# Patient Record
Sex: Female | Born: 2000 | Race: Black or African American | Hispanic: No | Marital: Single | Smoking: Never smoker
Health system: Southern US, Community
[De-identification: ages and names within clinical notes are randomized; demographics above are authoritative.]

## PROBLEM LIST (undated history)

## (undated) DIAGNOSIS — Z789 Other specified health status: Secondary | ICD-10-CM

## (undated) HISTORY — DX: Other specified health status: Z78.9

---
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
## 2018-01-01 DIAGNOSIS — Z3202 Encounter for pregnancy test, result negative: Secondary | ICD-10-CM | POA: Diagnosis not present

## 2018-01-01 DIAGNOSIS — Z3042 Encounter for surveillance of injectable contraceptive: Secondary | ICD-10-CM | POA: Diagnosis not present

## 2018-11-02 DIAGNOSIS — Z3049 Encounter for surveillance of other contraceptives: Secondary | ICD-10-CM | POA: Diagnosis not present

## 2018-11-17 DIAGNOSIS — Z3042 Encounter for surveillance of injectable contraceptive: Secondary | ICD-10-CM | POA: Diagnosis not present

## 2018-11-17 DIAGNOSIS — Z3009 Encounter for other general counseling and advice on contraception: Secondary | ICD-10-CM | POA: Diagnosis not present

## 2018-12-07 DIAGNOSIS — H90A31 Mixed conductive and sensorineural hearing loss, unilateral, right ear with restricted hearing on the contralateral side: Secondary | ICD-10-CM | POA: Diagnosis not present

## 2018-12-09 DIAGNOSIS — H5213 Myopia, bilateral: Secondary | ICD-10-CM | POA: Diagnosis not present

## 2018-12-19 DIAGNOSIS — H5213 Myopia, bilateral: Secondary | ICD-10-CM | POA: Diagnosis not present

## 2019-01-03 DIAGNOSIS — H52223 Regular astigmatism, bilateral: Secondary | ICD-10-CM | POA: Diagnosis not present

## 2019-01-20 ENCOUNTER — Encounter (HOSPITAL_COMMUNITY): Payer: Self-pay | Admitting: Emergency Medicine

## 2019-01-20 ENCOUNTER — Emergency Department (HOSPITAL_COMMUNITY): Payer: Medicaid Other

## 2019-01-20 ENCOUNTER — Other Ambulatory Visit: Payer: Self-pay

## 2019-01-20 ENCOUNTER — Emergency Department (HOSPITAL_COMMUNITY)
Admission: EM | Admit: 2019-01-20 | Discharge: 2019-01-20 | Disposition: A | Discharge status: Home / Self Care | Payer: Medicaid Other | Attending: Emergency Medicine | Admitting: Emergency Medicine

## 2019-01-20 DIAGNOSIS — Y939 Activity, unspecified: Secondary | ICD-10-CM | POA: Diagnosis not present

## 2019-01-20 DIAGNOSIS — Z23 Encounter for immunization: Secondary | ICD-10-CM | POA: Diagnosis not present

## 2019-01-20 DIAGNOSIS — W208XXA Other cause of strike by thrown, projected or falling object, initial encounter: Secondary | ICD-10-CM | POA: Diagnosis not present

## 2019-01-20 DIAGNOSIS — Y929 Unspecified place or not applicable: Secondary | ICD-10-CM | POA: Diagnosis not present

## 2019-01-20 DIAGNOSIS — S81812A Laceration without foreign body, left lower leg, initial encounter: Secondary | ICD-10-CM | POA: Insufficient documentation

## 2019-01-20 DIAGNOSIS — Y999 Unspecified external cause status: Secondary | ICD-10-CM | POA: Insufficient documentation

## 2019-01-20 DIAGNOSIS — S8012XA Contusion of left lower leg, initial encounter: Secondary | ICD-10-CM | POA: Insufficient documentation

## 2019-01-20 DIAGNOSIS — S8992XA Unspecified injury of left lower leg, initial encounter: Secondary | ICD-10-CM | POA: Diagnosis not present

## 2019-01-20 MED ORDER — TETANUS-DIPHTH-ACELL PERTUSSIS 5-2.5-18.5 LF-MCG/0.5 IM SUSP
0.5000 mL | Freq: Once | INTRAMUSCULAR | Status: AC
  Administered 2019-01-20: 0.5 mL via INTRAMUSCULAR
  Filled 2019-01-20: qty 0.5

## 2019-01-20 NOTE — ED Notes (Signed)
Patient verbalizes understanding of admission and discharge paperwork. Opportunity for questions and answers provided.
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
## 2019-01-20 NOTE — Discharge Instructions (Signed)
Please read attached information. If you experience any new or worsening signs or symptoms please return to the emergency room for evaluation. Please follow-up with your primary care provider or specialist as discussed.  °

## 2019-01-25 ENCOUNTER — Emergency Department (HOSPITAL_COMMUNITY)
Admission: EM | Admit: 2019-01-25 | Discharge: 2019-01-25 | Disposition: A | Discharge status: Home / Self Care | Payer: Medicaid Other | Attending: Emergency Medicine | Admitting: Emergency Medicine

## 2019-01-25 ENCOUNTER — Encounter (HOSPITAL_COMMUNITY): Payer: Self-pay | Admitting: Emergency Medicine

## 2019-01-25 ENCOUNTER — Other Ambulatory Visit: Payer: Self-pay

## 2019-01-25 ENCOUNTER — Emergency Department (HOSPITAL_BASED_OUTPATIENT_CLINIC_OR_DEPARTMENT_OTHER): Payer: Medicaid Other

## 2019-01-25 DIAGNOSIS — M7989 Other specified soft tissue disorders: Secondary | ICD-10-CM

## 2019-01-25 DIAGNOSIS — M79609 Pain in unspecified limb: Secondary | ICD-10-CM | POA: Diagnosis not present

## 2019-01-25 DIAGNOSIS — M79605 Pain in left leg: Secondary | ICD-10-CM | POA: Insufficient documentation

## 2019-01-25 MED ORDER — ACETAMINOPHEN 325 MG PO TABS
650.0000 mg | ORAL_TABLET | Freq: Once | ORAL | Status: AC
  Administered 2019-01-25: 650 mg via ORAL
  Filled 2019-01-25: qty 2

## 2019-01-25 MED ORDER — MELOXICAM 10 MG PO CAPS: 10.0000 mg | ORAL_CAPSULE | Freq: Every day | ORAL | 0 refills | Status: DC
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
## 2019-01-25 NOTE — ED Notes (Signed)
Patient and her mom at bedside both verbalized understanding of discharge instructions and deny any further needs or questions at this time. VS stable. Patient ambulatory with steady gait, assisted to ED entrance in wheelchair. Wound care education provided as well.
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
## 2019-01-25 NOTE — Progress Notes (Signed)
Lower extremity venous has been completed.   Preliminary results in CV Proc.   Whitney Gomez 01/25/2019 5:42 PM

## 2019-01-25 NOTE — Discharge Instructions (Addendum)
You were seen in the emergency department today for left lower leg pain.  Your ultrasound was negative for a blood clot.  Your x-ray from your prior visit was reviewed and did not show fracture or dislocation.  Your wounds appear to be healing appropriately.  Given your continued pain would like you to try taking meloxicam once per day as needed for pain/swelling.  - Meloxicam is a nonsteroidal anti-inflammatory medication that will help with pain and swelling. Be sure to take this medication as prescribed with food, 1 pill every 24 hours  It should be taken with food, as it can cause stomach upset, and more seriously, stomach bleeding. Do not take other nonsteroidal anti-inflammatory medications with this such as Advil, Motrin, Aleve, Naproxen, Goodie Powder, or Motrin.    You make take Tylenol per over the counter dosing with these medications.   We have prescribed you new medication(s) today. Discuss the medications prescribed today with your pharmacist as they can have adverse effects and interactions with your other medicines including over the counter and prescribed medications. Seek medical evaluation if you start to experience new or abnormal symptoms after taking one of these medicines, seek care immediately if you start to experience difficulty breathing, feeling of your throat closing, facial swelling, or rash as these could be indications of a more serious allergic reaction  Please call the orthopedic surgeons office in your discharge instructions for follow-up within the next 1 to 5 days.  Return to the ER for new or worsening symptoms including but not limited to increased pain, increased swelling, numbness, tingling, weakness, redness around the wounds, purulent drainage from the wounds, fever, or any other concerns.

## 2019-01-28 ENCOUNTER — Ambulatory Visit (INDEPENDENT_AMBULATORY_CARE_PROVIDER_SITE_OTHER): Payer: Self-pay | Admitting: Orthopedic Surgery

## 2019-01-28 ENCOUNTER — Telehealth: Payer: Self-pay

## 2019-01-28 ENCOUNTER — Encounter: Payer: Self-pay | Admitting: Orthopedic Surgery

## 2019-01-28 DIAGNOSIS — S8012XA Contusion of left lower leg, initial encounter: Secondary | ICD-10-CM

## 2019-01-28 NOTE — Telephone Encounter (Signed)
Talked with patient and advised her that note is ready to be picked up at the front desk.

## 2019-01-28 NOTE — Telephone Encounter (Signed)
Gustine for note can you please do and call patient?

## 2019-01-28 NOTE — Telephone Encounter (Signed)
Patient would like a note stating that she was seen in the office today.  Please call when note is ready.  Cb# is [phone].  Thank you.
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
## 2019-01-30 ENCOUNTER — Encounter: Payer: Self-pay | Admitting: Orthopedic Surgery

## 2019-02-09 ENCOUNTER — Other Ambulatory Visit: Payer: Self-pay

## 2019-02-09 ENCOUNTER — Encounter: Payer: Self-pay | Admitting: Family Medicine

## 2019-02-09 ENCOUNTER — Ambulatory Visit (INDEPENDENT_AMBULATORY_CARE_PROVIDER_SITE_OTHER): Payer: Medicaid Other | Admitting: Family Medicine

## 2019-02-09 VITALS — BP 102/61 | HR 89 | Temp 98.0°F | Ht 67.0 in | Wt 119.4 lb

## 2019-02-09 DIAGNOSIS — Z3202 Encounter for pregnancy test, result negative: Secondary | ICD-10-CM

## 2019-02-09 DIAGNOSIS — M79605 Pain in left leg: Secondary | ICD-10-CM

## 2019-02-09 DIAGNOSIS — Z3042 Encounter for surveillance of injectable contraceptive: Secondary | ICD-10-CM

## 2019-02-09 DIAGNOSIS — Z0000 Encounter for general adult medical examination without abnormal findings: Secondary | ICD-10-CM | POA: Diagnosis not present

## 2019-02-09 DIAGNOSIS — Z309 Encounter for contraceptive management, unspecified: Secondary | ICD-10-CM

## 2019-02-09 DIAGNOSIS — Z09 Encounter for follow-up examination after completed treatment for conditions other than malignant neoplasm: Secondary | ICD-10-CM | POA: Diagnosis not present

## 2019-02-09 LAB — POCT URINALYSIS DIPSTICK
Bilirubin, UA: NEGATIVE
Blood, UA: NEGATIVE
Glucose, UA: NEGATIVE
Ketones, UA: NEGATIVE
Leukocytes, UA: NEGATIVE
Nitrite, UA: NEGATIVE
Protein, UA: NEGATIVE
Spec Grav, UA: >=1.03 — AB (ref 1.010–1.025)
Urobilinogen, UA: 0.2 E.U./dL
pH, UA: 6 (ref 5.0–8.0)

## 2019-02-09 LAB — POCT URINE PREGNANCY: Preg Test, Ur: NEGATIVE

## 2019-02-09 MED ORDER — MELOXICAM 10 MG PO CAPS: 10.0000 mg | ORAL_CAPSULE | Freq: Every day | ORAL | 1 refills | Status: DC

## 2019-02-09 MED ORDER — MELOXICAM 7.5 MG PO TABS: 7.5000 mg | ORAL_TABLET | Freq: Every day | ORAL | 3 refills | Status: DC

## 2019-02-09 MED ORDER — MEDROXYPROGESTERONE ACETATE 150 MG/ML IM SUSP
150.0000 mg | Freq: Once | INTRAMUSCULAR | Status: AC
  Administered 2019-02-09: 150 mg via INTRAMUSCULAR
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
## 2019-02-09 NOTE — Patient Instructions (Signed)
Musculoskeletal Pain Musculoskeletal pain refers to aches and pains in your bones, joints, muscles, and the tissues that surround them. This pain can occur in any part of the body. It can last for a short time (acute) or a long time (chronic). A physical exam, lab tests, and imaging studies may be done to find the cause of your musculoskeletal pain. Follow these instructions at home:  Lifestyle  Try to control or lower your stress levels. Stress increases muscle tension and can worsen musculoskeletal pain. It is important to recognize when you are anxious or stressed and learn ways to manage it. This may include: ? Meditation or yoga. ? Cognitive or behavioral therapy. ? Acupuncture or massage therapy.  You may continue all activities unless the activities cause more pain. When the pain gets better, slowly resume your normal activities. Gradually increase the intensity and duration of your activities or exercise. Managing pain, stiffness, and swelling  Take over-the-counter and prescription medicines only as told by your health care provider.  When your pain is severe, bed rest may be helpful. Lie or sit in any position that is comfortable, but get out of bed and walk around at least every couple of hours.  If directed, apply heat to the affected area as often as told by your health care provider. Use the heat source that your health care provider recommends, such as a moist heat pack or a heating pad. ? Place a towel between your skin and the heat source. ? Leave the heat on for 20-30 minutes. ? Remove the heat if your skin turns bright red. This is especially important if you are unable to feel pain, heat, or cold. You may have a greater risk of getting burned.  If directed, put ice on the painful area. ? Put ice in a plastic bag. ? Place a towel between your skin and the bag. ? Leave the ice on for 20 minutes, 2-3 times a day. General instructions  Your health care provider may  recommend that you see a physical therapist. This person can help you come up with a safe exercise program. Do any exercises as told by your physical therapist.  Keep all follow-up visits, including any physical therapy visits, as told by your health care providers. This is important. Contact a health care provider if:  Your pain gets worse.  Medicines do not help ease your pain.  You cannot use the part of your body that hurts, such as your arm, leg, or neck.  You have trouble sleeping.  You have trouble doing your normal activities. Get help right away if:  You have a new injury and your pain is worse or different.  You feel numb or you have tingling in the painful area. Summary  Musculoskeletal pain refers to aches and pains in your bones, joints, muscles, and the tissues that surround them.  This pain can occur in any part of the body.  Your health care provider may recommend that you see a physical therapist. This person can help you come up with a safe exercise program. Do any exercises as told by your physical therapist.  Lower your stress level. Stress can worsen musculoskeletal pain. Ways to lower stress may include meditation, yoga, cognitive or behavioral therapy, acupuncture, and massage therapy. This information is not intended to replace advice given to you by your health care provider. Make sure you discuss any questions you have with your health care provider. Document Released: 04/28/2005 Document Revised: 04/10/2017 Document Reviewed:   05/28/2016 Elsevier Patient Education  2020 Elsevier Inc. Meloxicam capsules What is this medicine? MELOXICAM (mel OX i cam) is a non-steroidal anti-inflammatory drug (NSAID). It is used to reduce swelling and to treat pain. It is used for osteoarthritis. This medicine may be used for other purposes; ask your health care provider or pharmacist if you have questions. COMMON BRAND NAME(S): Vivlodex What should I tell my health care  provider before I take this medicine? They need to know if you have any of these conditions:  bleeding disorders  cigarette smoker  coronary artery bypass graft (CABG) surgery within the past 2 weeks  drink more than 3 alcohol-containing drinks per day  heart disease  high blood pressure  history of stomach bleeding  kidney disease  liver disease  lung or breathing disease, like asthma  stomach or intestine problems  an unusual or allergic reaction to meloxicam, aspirin, other NSAIDs, other medicines, foods, dyes, or preservatives  pregnant or trying to get pregnant  breast-feeding How should I use this medicine? Take this medicine by mouth with a full glass of water. Follow the directions on the prescription label. You can take it with or without food. If it upsets your stomach, take it with food. Take your medicine at regular intervals. Do not take it more often than directed. Do not stop taking except on your doctor's advice. A special MedGuide will be given to you by the pharmacist with each prescription and refill. Be sure to read this information carefully each time. Talk to your pediatrician regarding the use of this medicine in children. Special care may be needed. Patients over 11 years old may have a stronger reaction and need a smaller dose. Overdosage: If you think you have taken too much of this medicine contact a poison control center or emergency room at once. NOTE: This medicine is only for you. Do not share this medicine with others. What if I miss a dose? If you miss a dose, take it as soon as you can. If it is almost time for your next dose, take only that dose. Do not take double or extra doses. What may interact with this medicine? Do not take this medicine with any of the following medications:  cidofovir  ketorolac This medicine may also interact with the following medications:  aspirin and aspirin-like medicines  certain medicines for blood  pressure, heart disease, irregular heart beat  certain medicines for depression, anxiety, or psychotic disturbances  certain medicines that treat or prevent blood clots like warfarin, enoxaparin, dalteparin, apixaban, dabigatran, rivaroxaban  cyclosporine  diuretics  fluconazole  lithium  methotrexate  other NSAIDs, medicines for pain and inflammation, like ibuprofen and naproxen  pemetrexed This list may not describe all possible interactions. Give your health care provider a list of all the medicines, herbs, non-prescription drugs, or dietary supplements you use. Also tell them if you smoke, drink alcohol, or use illegal drugs. Some items may interact with your medicine. What should I watch for while using this medicine? Tell your doctor or healthcare provider if your symptoms do not start to get better or if they get worse. This medicine may cause serious skin reactions. They can happen weeks to months after starting the medicine. Contact your healthcare provider right away if you notice fevers or flu-like symptoms with a rash. The rash may be red or purple and then turn into blisters or peeling of the skin. Or, you might notice a red rash with swelling of the face, lips  or lymph nodes in your neck or under your arms. Do not take other medicines that contain aspirin, ibuprofen, or naproxen with this medicine. Side effects such as stomach upset, nausea, or ulcers may be more likely to occur. Many medicines available without a prescription should not be taken with this medicine. This medicine can cause ulcers and bleeding in the stomach and intestines at any time during treatment. This can happen with no warning and may cause death. There is increased risk with taking this medicine for a long time. Smoking, drinking alcohol, older age, and poor health can also increase risks. Call your doctor right away if you have stomach pain or blood in your vomit or stool. This medicine does not prevent  heart attack or stroke. In fact, this medicine may increase the chance of a heart attack or stroke. The chance may increase with longer use of this medicine and in people who have heart disease. If you take aspirin to prevent heart attack or stroke, talk with your doctor or healthcare provider. What side effects may I notice from receiving this medicine? Side effects that you should report to your doctor or health care professional as soon as possible:  allergic reactions like skin rash, itching or hives, swelling of the face, lips, or tongue  nausea, vomiting  redness, blistering, peeling, or loosening of the skin, including inside the mouth  signs and symptoms of a blood clot such as breathing problems; changes in vision; chest pain; severe, sudden headache; pain, swelling, warmth in the leg; trouble speaking; sudden numbness or weakness of the face, arm, or leg  signs and symptoms of bleeding such as bloody or black, tarry stools; red or dark-brown urine; spitting up blood or brown material that looks like coffee grounds; red spots on the skin; unusual bruising or bleeding from the eye, gums, or nose  signs and symptoms of liver injury like dark yellow or brown urine; general ill feeling or flu-like symptoms; light-colored stools; loss of appetite; nausea; right upper belly pain; unusually weak or tired; yellowing of the eyes or skin  signs and symptoms of stroke like changes in vision; confusion; trouble speaking or understanding; severe headaches; sudden numbness or weakness of the face, arm, or leg; trouble walking; dizziness; loss of balance or coordination Side effects that usually do not require medical attention (report to your doctor or health care professional if they continue or are bothersome):  constipation  diarrhea  gas This list may not describe all possible side effects. Call your doctor for medical advice about side effects. You may report side effects to FDA at  1-800-FDA-1088. Where should I keep my medicine? Keep out of the reach of children. Store at room temperature between 15 and 30 degrees C (59 and 86 degrees F). Throw away any unused medicine after the expiration date. NOTE: This sheet is a summary. It may not cover all possible information. If you have questions about this medicine, talk to your doctor, pharmacist, or health care provider.  2020 Elsevier/Gold Standard (2018-07-28 11:19:03)

## 2019-02-10 ENCOUNTER — Encounter: Payer: Self-pay | Admitting: Family Medicine

## 2019-02-10 DIAGNOSIS — M79605 Pain in left leg: Secondary | ICD-10-CM | POA: Insufficient documentation

## 2019-02-10 DIAGNOSIS — Z309 Encounter for contraceptive management, unspecified: Secondary | ICD-10-CM | POA: Insufficient documentation

## 2019-02-10 LAB — CBC WITH DIFFERENTIAL/PLATELET
Basophils Absolute: 0 10*3/uL (ref 0.0–0.2)
Basos: 1 %
EOS (ABSOLUTE): 0 10*3/uL (ref 0.0–0.4)
Eos: 1 %
Hematocrit: 39.3 % (ref 34.0–46.6)
Hemoglobin: 13.3 g/dL (ref 11.1–15.9)
Immature Grans (Abs): 0 10*3/uL (ref 0.0–0.1)
Immature Granulocytes: 0 %
Lymphocytes Absolute: 1.7 10*3/uL (ref 0.7–3.1)
Lymphs: 51 %
MCH: 30.6 pg (ref 26.6–33.0)
MCHC: 33.8 g/dL (ref 31.5–35.7)
MCV: 91 fL (ref 79–97)
Monocytes Absolute: 0.2 10*3/uL (ref 0.1–0.9)
Monocytes: 7 %
Neutrophils Absolute: 1.3 10*3/uL — ABNORMAL LOW (ref 1.4–7.0)
Neutrophils: 40 %
Platelets: 194 10*3/uL (ref 150–450)
RBC: 4.34 x10E6/uL (ref 3.77–5.28)
RDW: 12.5 % (ref 11.7–15.4)
WBC: 3.4 10*3/uL (ref 3.4–10.8)

## 2019-02-10 LAB — COMPREHENSIVE METABOLIC PANEL
ALT: 11 IU/L (ref 0–32)
AST: 19 IU/L (ref 0–40)
Albumin/Globulin Ratio: 2.3 — ABNORMAL HIGH (ref 1.2–2.2)
Albumin: 4.6 g/dL (ref 3.9–5.0)
Alkaline Phosphatase: 55 IU/L (ref 39–117)
BUN/Creatinine Ratio: 12 (ref 9–23)
BUN: 11 mg/dL (ref 6–20)
Bilirubin Total: 0.6 mg/dL (ref 0.0–1.2)
CO2: 22 mmol/L (ref 20–29)
Calcium: 9.7 mg/dL (ref 8.7–10.2)
Chloride: 105 mmol/L (ref 96–106)
Creatinine, Ser: 0.89 mg/dL (ref 0.57–1.00)
GFR calc Af Amer: 108 mL/min/{1.73_m2} (ref >59)
GFR calc non Af Amer: 94 mL/min/{1.73_m2} (ref >59)
Globulin, Total: 2 g/dL (ref 1.5–4.5)
Glucose: 62 mg/dL — ABNORMAL LOW (ref 65–99)
Potassium: 4 mmol/L (ref 3.5–5.2)
Sodium: 143 mmol/L (ref 134–144)
Total Protein: 6.6 g/dL (ref 6.0–8.5)

## 2019-02-10 LAB — HEPB+HEPC+HIV PANEL
HIV Screen 4th Generation wRfx: NONREACTIVE
Hep B C IgM: NEGATIVE
Hep B Core Total Ab: NEGATIVE
Hep B E Ab: NEGATIVE
Hep B E Ag: NEGATIVE
Hep B Surface Ab, Qual: REACTIVE
Hep C Virus Ab: <0.1 s/co ratio (ref 0.0–0.9)
Hepatitis B Surface Ag: NEGATIVE

## 2019-02-16 ENCOUNTER — Encounter: Payer: Self-pay | Admitting: Orthopedic Surgery

## 2019-02-16 ENCOUNTER — Ambulatory Visit (INDEPENDENT_AMBULATORY_CARE_PROVIDER_SITE_OTHER): Payer: Medicaid Other | Admitting: Orthopedic Surgery

## 2019-02-16 VITALS — Ht 67.0 in | Wt 122.0 lb

## 2019-02-16 DIAGNOSIS — S8012XA Contusion of left lower leg, initial encounter: Secondary | ICD-10-CM

## 2019-02-18 ENCOUNTER — Encounter: Payer: Self-pay | Admitting: Orthopedic Surgery
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
## 2019-02-24 ENCOUNTER — Encounter: Payer: Self-pay | Admitting: Physical Therapy

## 2019-02-24 ENCOUNTER — Ambulatory Visit: Payer: Medicaid Other | Attending: Orthopedic Surgery | Admitting: Physical Therapy

## 2019-02-24 ENCOUNTER — Other Ambulatory Visit: Payer: Self-pay

## 2019-02-24 DIAGNOSIS — S8012XA Contusion of left lower leg, initial encounter: Secondary | ICD-10-CM | POA: Insufficient documentation

## 2019-02-24 DIAGNOSIS — S8002XA Contusion of left knee, initial encounter: Secondary | ICD-10-CM | POA: Diagnosis not present
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
## 2019-03-18 ENCOUNTER — Ambulatory Visit: Payer: Medicaid Other | Admitting: Physical Therapy

## 2019-03-21 ENCOUNTER — Telehealth: Payer: Self-pay | Admitting: Family Medicine

## 2019-03-22 NOTE — Telephone Encounter (Signed)
Patient referral is still good until 02/2020. She needs to call back and make an appointment. I could not leave a message, mailbox full.

## 2019-03-29 ENCOUNTER — Telehealth: Payer: Self-pay

## 2019-03-29 ENCOUNTER — Other Ambulatory Visit: Payer: Self-pay | Admitting: Family Medicine

## 2019-03-29 NOTE — Telephone Encounter (Signed)
Whitney Gomez is seeking another referral to [facility]. For continued PT, ASAP.   Dx: V72.82SU (ICD-10-CM) - Contusion of left lower extremity, initial encounter.  Patient call back ph#[phone]

## 2019-03-30 ENCOUNTER — Other Ambulatory Visit: Payer: Self-pay | Admitting: Family Medicine

## 2019-03-30 DIAGNOSIS — M79605 Pain in left leg: Secondary | ICD-10-CM

## 2019-03-30 NOTE — Telephone Encounter (Signed)
Per PT note on  02/2019: Patient will benefit from skilled therapeutic intervention in order to improve the following deficits and impairments. It was a one time visit.      Will you put in another referral for more therapy?

## 2019-03-31 ENCOUNTER — Other Ambulatory Visit: Payer: Self-pay

## 2019-03-31 ENCOUNTER — Encounter: Payer: Self-pay | Admitting: Physical Therapy

## 2019-03-31 ENCOUNTER — Ambulatory Visit: Payer: Medicaid Other | Attending: Family Medicine | Admitting: Physical Therapy

## 2019-03-31 DIAGNOSIS — S8002XA Contusion of left knee, initial encounter: Secondary | ICD-10-CM | POA: Insufficient documentation

## 2019-03-31 DIAGNOSIS — S8012XA Contusion of left lower leg, initial encounter: Secondary | ICD-10-CM | POA: Insufficient documentation
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
## 2019-04-01 ENCOUNTER — Telehealth: Payer: Self-pay
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
## 2019-04-04 ENCOUNTER — Other Ambulatory Visit: Payer: Self-pay | Admitting: Family Medicine

## 2019-04-04 ENCOUNTER — Telehealth: Payer: Self-pay

## 2019-04-04 DIAGNOSIS — M79605 Pain in left leg: Secondary | ICD-10-CM

## 2019-04-04 NOTE — Telephone Encounter (Signed)
This was faxed over today 04/04/2019 @1 :43pm. Thanks !

## 2019-04-04 NOTE — Telephone Encounter (Signed)
Received a call from Physical Therapy in [place]. They need a written order for this. Can you please write this and we can fax it over to them. It must include "Physical Therapy to Evaluate and Treat" ICD-10 Dx code. And Provider signature. Thank you!

## 2019-04-04 NOTE — Telephone Encounter (Signed)
Rx paper set on desk

## 2019-04-12 DIAGNOSIS — H9192 Unspecified hearing loss, left ear: Secondary | ICD-10-CM

## 2019-04-12 HISTORY — DX: Unspecified hearing loss, left ear: H91.92

## 2019-04-28 ENCOUNTER — Telehealth: Payer: Self-pay

## 2019-04-28 ENCOUNTER — Encounter: Payer: Self-pay | Admitting: Family Medicine

## 2019-04-28 ENCOUNTER — Other Ambulatory Visit: Payer: Self-pay | Admitting: Family Medicine

## 2019-04-28 DIAGNOSIS — H9192 Unspecified hearing loss, left ear: Secondary | ICD-10-CM

## 2019-04-28 NOTE — Telephone Encounter (Signed)
Patient requesting a referral to [facility] Phone [phone]. Hearing evaluation for decreased hearing in the left ear. Patient is currently in her hometown.

## 2019-05-04 NOTE — Telephone Encounter (Signed)
Contacted pt and left a detailed vm informing pt that referral has been sent and if she has any questions or concerns to give a call

## 2019-05-09 ENCOUNTER — Telehealth: Payer: Self-pay

## 2019-05-09 NOTE — Telephone Encounter (Signed)
All information has been sent. Referral given by prior Pediatric PCP authorization code [id number] E- Good for Month of  January. Patient will need to up date PCP on medicaid card. Not able to leave message on  Voice mail.

## 2019-05-24 DIAGNOSIS — H9190 Unspecified hearing loss, unspecified ear: Secondary | ICD-10-CM | POA: Insufficient documentation

## 2019-05-30 ENCOUNTER — Other Ambulatory Visit: Payer: Self-pay

## 2019-05-30 ENCOUNTER — Ambulatory Visit (INDEPENDENT_AMBULATORY_CARE_PROVIDER_SITE_OTHER): Payer: Medicaid Other

## 2019-05-30 DIAGNOSIS — Z309 Encounter for contraceptive management, unspecified: Secondary | ICD-10-CM

## 2019-05-30 LAB — POCT URINE PREGNANCY: Preg Test, Ur: NEGATIVE

## 2019-05-30 MED ORDER — MEDROXYPROGESTERONE ACETATE 150 MG/ML IM SUSP
150.0000 mg | Freq: Once | INTRAMUSCULAR | Status: AC
  Administered 2019-05-30: 150 mg via INTRAMUSCULAR

## 2019-05-31 ENCOUNTER — Other Ambulatory Visit: Payer: Self-pay | Admitting: Family Medicine

## 2019-05-31 ENCOUNTER — Telehealth: Payer: Self-pay

## 2019-05-31 DIAGNOSIS — M79605 Pain in left leg: Secondary | ICD-10-CM

## 2019-05-31 NOTE — Telephone Encounter (Signed)
Patient called today. She is needing a new referral placed to see Physical Therapy for her left leg pain.We have referred her before a couple of months back but specialist office is saying she needs a new referral.  Referral needs to be sent to Outpatient Rehab in [place]. Can you please place referral?

## 2019-06-07 DIAGNOSIS — Z461 Encounter for fitting and adjustment of hearing aid: Secondary | ICD-10-CM | POA: Diagnosis not present

## 2019-06-07 DIAGNOSIS — H9190 Unspecified hearing loss, unspecified ear: Secondary | ICD-10-CM | POA: Diagnosis not present

## 2019-06-14 ENCOUNTER — Other Ambulatory Visit: Payer: Self-pay

## 2019-06-14 ENCOUNTER — Ambulatory Visit (INDEPENDENT_AMBULATORY_CARE_PROVIDER_SITE_OTHER): Payer: Medicaid Other | Admitting: Orthopedic Surgery

## 2019-06-14 DIAGNOSIS — S8012XA Contusion of left lower leg, initial encounter: Secondary | ICD-10-CM | POA: Diagnosis not present

## 2019-06-15 ENCOUNTER — Ambulatory Visit: Payer: No Typology Code available for payment source | Admitting: Physical Therapy

## 2019-06-18 ENCOUNTER — Encounter: Payer: Self-pay | Admitting: Orthopedic Surgery
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
## 2019-08-22 ENCOUNTER — Encounter: Payer: Self-pay | Admitting: Family Medicine

## 2019-08-22 ENCOUNTER — Other Ambulatory Visit: Payer: Self-pay

## 2019-08-22 ENCOUNTER — Ambulatory Visit (INDEPENDENT_AMBULATORY_CARE_PROVIDER_SITE_OTHER): Payer: Medicaid Other | Admitting: Family Medicine

## 2019-08-22 VITALS — Temp 98.9°F | Ht 67.0 in | Wt 117.2 lb

## 2019-08-22 DIAGNOSIS — Z3042 Encounter for surveillance of injectable contraceptive: Secondary | ICD-10-CM

## 2019-08-22 DIAGNOSIS — Z0000 Encounter for general adult medical examination without abnormal findings: Secondary | ICD-10-CM

## 2019-08-22 DIAGNOSIS — Z09 Encounter for follow-up examination after completed treatment for conditions other than malignant neoplasm: Secondary | ICD-10-CM

## 2019-08-22 DIAGNOSIS — Z3202 Encounter for pregnancy test, result negative: Secondary | ICD-10-CM

## 2019-08-22 LAB — POCT URINALYSIS DIPSTICK
Bilirubin, UA: NEGATIVE
Blood, UA: NEGATIVE
Glucose, UA: NEGATIVE
Ketones, UA: NEGATIVE
Leukocytes, UA: NEGATIVE
Nitrite, UA: NEGATIVE
Protein, UA: POSITIVE — AB
Spec Grav, UA: 1.02 (ref 1.010–1.025)
Urobilinogen, UA: 1 E.U./dL
pH, UA: 8.5 — AB (ref 5.0–8.0)

## 2019-08-22 LAB — POCT URINE PREGNANCY: Preg Test, Ur: NEGATIVE

## 2019-08-22 MED ORDER — MEDROXYPROGESTERONE ACETATE 150 MG/ML IM SUSP: 150.0000 mg | Freq: Once | INTRAMUSCULAR | Status: DC

## 2019-08-23 ENCOUNTER — Other Ambulatory Visit: Payer: Self-pay

## 2019-08-23 DIAGNOSIS — Z3202 Encounter for pregnancy test, result negative: Secondary | ICD-10-CM | POA: Diagnosis not present

## 2019-08-23 DIAGNOSIS — Z3042 Encounter for surveillance of injectable contraceptive: Secondary | ICD-10-CM | POA: Diagnosis not present

## 2019-08-23 LAB — POCT URINE PREGNANCY: Preg Test, Ur: NEGATIVE

## 2019-08-23 MED ORDER — MEDROXYPROGESTERONE ACETATE 150 MG/ML IM SUSP
150.0000 mg | Freq: Once | INTRAMUSCULAR | Status: AC
  Administered 2019-08-23: 08:00:00 150 mg via INTRAMUSCULAR

## 2019-08-31 ENCOUNTER — Other Ambulatory Visit: Payer: Self-pay

## 2019-08-31 DIAGNOSIS — Z3202 Encounter for pregnancy test, result negative: Secondary | ICD-10-CM | POA: Diagnosis not present

## 2019-08-31 DIAGNOSIS — Z3042 Encounter for surveillance of injectable contraceptive: Secondary | ICD-10-CM | POA: Diagnosis not present

## 2019-08-31 MED ORDER — MEDROXYPROGESTERONE ACETATE 150 MG/ML IM SUSP
150.0000 mg | Freq: Once | INTRAMUSCULAR | Status: AC
  Administered 2019-08-31: 150 mg via INTRAMUSCULAR

## 2019-08-31 NOTE — Addendum Note (Signed)
Addended by: Becky Sax on: 08/31/2019 08:29 AM   Modules accepted: Orders

## 2019-10-17 ENCOUNTER — Ambulatory Visit: Payer: Medicaid Other | Admitting: Family Medicine

## 2019-10-19 ENCOUNTER — Ambulatory Visit: Payer: Medicaid Other | Admitting: Family Medicine

## 2019-10-25 ENCOUNTER — Other Ambulatory Visit: Payer: Self-pay

## 2019-10-25 ENCOUNTER — Ambulatory Visit: Payer: Medicaid Other | Admitting: Family Medicine

## 2019-11-17 ENCOUNTER — Telehealth: Payer: Self-pay | Admitting: Family Medicine

## 2019-11-17 NOTE — Telephone Encounter (Signed)
Pt had an appt scheduled for tomorrow for her depo shot (now cancelled), however pt states they just received the shot in June. When will the pt need to follow up for her next depo shot.

## 2019-11-18 ENCOUNTER — Ambulatory Visit (INDEPENDENT_AMBULATORY_CARE_PROVIDER_SITE_OTHER): Payer: Medicaid Other | Admitting: Family Medicine

## 2019-11-18 ENCOUNTER — Other Ambulatory Visit: Payer: Self-pay

## 2019-11-18 DIAGNOSIS — Z3042 Encounter for surveillance of injectable contraceptive: Secondary | ICD-10-CM | POA: Diagnosis not present

## 2019-11-18 LAB — POCT URINE PREGNANCY: Preg Test, Ur: NEGATIVE

## 2019-11-18 MED ORDER — MEDROXYPROGESTERONE ACETATE 150 MG/ML IM SUSP
150.0000 mg | Freq: Once | INTRAMUSCULAR | Status: AC
  Administered 2019-11-18: 150 mg via INTRAMUSCULAR

## 2019-11-18 NOTE — Progress Notes (Signed)
Patient in today for her Depo Injection. She is due back between September 24- October 8.

## 2019-12-06 DIAGNOSIS — Z20822 Contact with and (suspected) exposure to covid-19: Secondary | ICD-10-CM | POA: Diagnosis not present

## 2020-02-08 ENCOUNTER — Other Ambulatory Visit: Payer: Self-pay

## 2020-02-08 ENCOUNTER — Ambulatory Visit: Payer: Medicaid Other

## 2020-02-08 VITALS — BP 118/68 | HR 84 | Temp 98.1°F | Resp 17

## 2020-02-08 DIAGNOSIS — Z3042 Encounter for surveillance of injectable contraceptive: Secondary | ICD-10-CM

## 2020-02-08 MED ORDER — MEDROXYPROGESTERONE ACETATE 104 MG/0.65ML ~~LOC~~ SUSY: 104.0000 mg | PREFILLED_SYRINGE | Freq: Once | SUBCUTANEOUS | Status: DC

## 2020-02-08 MED ORDER — MEDROXYPROGESTERONE ACETATE 150 MG/ML IM SUSY
150.0000 mg | PREFILLED_SYRINGE | Freq: Once | INTRAMUSCULAR | Status: AC
  Administered 2020-02-08: 150 mg via INTRAMUSCULAR

## 2020-04-16 ENCOUNTER — Encounter: Payer: Self-pay | Admitting: Family Medicine

## 2020-04-20 ENCOUNTER — Other Ambulatory Visit: Payer: Self-pay | Admitting: Family Medicine

## 2020-04-20 DIAGNOSIS — M79605 Pain in left leg: Secondary | ICD-10-CM

## 2020-04-20 MED ORDER — CYCLOBENZAPRINE HCL 10 MG PO TABS: 10.0000 mg | ORAL_TABLET | Freq: Three times a day (TID) | ORAL | 0 refills | Status: DC | PRN

## 2020-04-24 ENCOUNTER — Ambulatory Visit: Payer: Medicaid Other | Admitting: Family Medicine

## 2020-04-25 ENCOUNTER — Telehealth: Payer: Self-pay

## 2020-04-25 ENCOUNTER — Other Ambulatory Visit: Payer: Self-pay

## 2020-04-25 ENCOUNTER — Ambulatory Visit (INDEPENDENT_AMBULATORY_CARE_PROVIDER_SITE_OTHER): Payer: Medicaid Other | Admitting: Family Medicine

## 2020-04-25 DIAGNOSIS — Z3042 Encounter for surveillance of injectable contraceptive: Secondary | ICD-10-CM | POA: Diagnosis not present

## 2020-04-25 MED ORDER — MEDROXYPROGESTERONE ACETATE 150 MG/ML IM SUSY
150.0000 mg | PREFILLED_SYRINGE | Freq: Once | INTRAMUSCULAR | Status: AC
  Administered 2020-04-25: 150 mg via INTRAMUSCULAR

## 2020-04-25 NOTE — Telephone Encounter (Signed)
Patient said that she has been having back pain due to working a lot and she wants to know what can she take to help with the pain. She has been taking Ibuprofen with little relief

## 2020-04-26 ENCOUNTER — Other Ambulatory Visit: Payer: Self-pay | Admitting: Family Medicine

## 2020-04-26 DIAGNOSIS — M79605 Pain in left leg: Secondary | ICD-10-CM

## 2020-04-26 MED ORDER — MELOXICAM 15 MG PO TABS: 15.0000 mg | ORAL_TABLET | Freq: Every day | ORAL | 6 refills | Status: DC

## 2020-04-27 NOTE — Telephone Encounter (Signed)
Called patient to inform med was at pharmacy for pick up

## 2020-05-21 ENCOUNTER — Ambulatory Visit (INDEPENDENT_AMBULATORY_CARE_PROVIDER_SITE_OTHER): Payer: Medicaid Other | Admitting: Family Medicine

## 2020-05-21 ENCOUNTER — Ambulatory Visit (HOSPITAL_COMMUNITY)
Admission: RE | Admit: 2020-05-21 | Discharge: 2020-05-21 | Disposition: A | Discharge status: Home / Self Care | Payer: Medicaid Other | Source: Ambulatory Visit | Attending: Family Medicine | Admitting: Family Medicine

## 2020-05-21 ENCOUNTER — Other Ambulatory Visit: Payer: Self-pay

## 2020-05-21 ENCOUNTER — Encounter: Payer: Self-pay | Admitting: Family Medicine

## 2020-05-21 DIAGNOSIS — Z0000 Encounter for general adult medical examination without abnormal findings: Secondary | ICD-10-CM

## 2020-05-21 DIAGNOSIS — M549 Dorsalgia, unspecified: Secondary | ICD-10-CM

## 2020-05-21 DIAGNOSIS — M545 Low back pain, unspecified: Secondary | ICD-10-CM | POA: Diagnosis not present

## 2020-05-21 DIAGNOSIS — Z09 Encounter for follow-up examination after completed treatment for conditions other than malignant neoplasm: Secondary | ICD-10-CM

## 2020-05-21 DIAGNOSIS — M546 Pain in thoracic spine: Secondary | ICD-10-CM | POA: Diagnosis not present

## 2020-05-21 LAB — POCT URINE PREGNANCY: Preg Test, Ur: NEGATIVE
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
## 2020-05-22 LAB — CBC WITH DIFFERENTIAL/PLATELET
Basophils Absolute: 0 10*3/uL (ref 0.0–0.2)
Basos: 0 %
EOS (ABSOLUTE): 0.1 10*3/uL (ref 0.0–0.4)
Eos: 2 %
Hematocrit: 40.6 % (ref 34.0–46.6)
Hemoglobin: 13.5 g/dL (ref 11.1–15.9)
Immature Grans (Abs): 0 10*3/uL (ref 0.0–0.1)
Immature Granulocytes: 0 %
Lymphocytes Absolute: 1.9 10*3/uL (ref 0.7–3.1)
Lymphs: 37 %
MCH: 30.5 pg (ref 26.6–33.0)
MCHC: 33.3 g/dL (ref 31.5–35.7)
MCV: 92 fL (ref 79–97)
Monocytes Absolute: 0.3 10*3/uL (ref 0.1–0.9)
Monocytes: 6 %
Neutrophils Absolute: 2.8 10*3/uL (ref 1.4–7.0)
Neutrophils: 55 %
Platelets: 194 10*3/uL (ref 150–450)
RBC: 4.43 x10E6/uL (ref 3.77–5.28)
RDW: 12.2 % (ref 11.7–15.4)
WBC: 5.1 10*3/uL (ref 3.4–10.8)

## 2020-05-22 LAB — COMPREHENSIVE METABOLIC PANEL
ALT: 11 IU/L (ref 0–32)
AST: 18 IU/L (ref 0–40)
Albumin/Globulin Ratio: 2 (ref 1.2–2.2)
Albumin: 4.6 g/dL (ref 3.9–5.0)
Alkaline Phosphatase: 50 IU/L (ref 44–121)
BUN/Creatinine Ratio: 10 (ref 9–23)
BUN: 9 mg/dL (ref 6–20)
Bilirubin Total: 0.9 mg/dL (ref 0.0–1.2)
CO2: 20 mmol/L (ref 20–29)
Calcium: 9.6 mg/dL (ref 8.7–10.2)
Chloride: 105 mmol/L (ref 96–106)
Creatinine, Ser: 0.86 mg/dL (ref 0.57–1.00)
GFR calc Af Amer: 112 mL/min/{1.73_m2} (ref >59)
GFR calc non Af Amer: 97 mL/min/{1.73_m2} (ref >59)
Globulin, Total: 2.3 g/dL (ref 1.5–4.5)
Glucose: 79 mg/dL (ref 65–99)
Potassium: 3.7 mmol/L (ref 3.5–5.2)
Sodium: 142 mmol/L (ref 134–144)
Total Protein: 6.9 g/dL (ref 6.0–8.5)

## 2020-05-24 ENCOUNTER — Encounter: Payer: Self-pay | Admitting: Family Medicine

## 2020-06-07 ENCOUNTER — Encounter: Payer: Self-pay | Admitting: Family Medicine

## 2020-06-08 ENCOUNTER — Other Ambulatory Visit: Payer: Self-pay | Admitting: Family Medicine

## 2020-06-08 DIAGNOSIS — H9190 Unspecified hearing loss, unspecified ear: Secondary | ICD-10-CM

## 2020-07-18 ENCOUNTER — Telehealth: Payer: Self-pay

## 2020-07-18 NOTE — Telephone Encounter (Signed)
Pt needs a referral to MRI please call number [phone]

## 2020-07-30 NOTE — Progress Notes (Deleted)
Date last pap: N/a. Last Depo-Provera: 04/25/2020. Side Effects if any: n/a. Serum HCG indicated? n/a. Depo-Provera 150 mg IM given by: ***.  Next appointment due June 6- June 21.

## 2020-08-08 DIAGNOSIS — S61256A Open bite of right little finger without damage to nail, initial encounter: Secondary | ICD-10-CM | POA: Diagnosis not present

## 2020-08-15 ENCOUNTER — Encounter: Payer: Self-pay | Admitting: Family Medicine

## 2020-08-15 ENCOUNTER — Telehealth: Payer: Self-pay | Admitting: Family Medicine

## 2020-08-15 NOTE — Telephone Encounter (Signed)
Multiple attempts to contact patient with concerns r/t her chronic back pain. Detailed discussions with mother (who is answering phone), and "stating the she is Ms. Scalise", until I asked her to describe concerns in detail, in which she admitted that she was the mother. At attempt to speak with Ms. Lupe herself today, mother stated that her daughter did not have a reachable phone number and had to go through her mother's phone. 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 Mychart messages may also be sent via mother also. Unfortunately, there is no other phone number listed in patient's chart,so I am unable to reach her personally. Messages left for mother to inform patient to make appointment via office visit for further assessment with back issues.

## 2020-10-24 ENCOUNTER — Other Ambulatory Visit: Payer: Self-pay

## 2020-10-24 ENCOUNTER — Ambulatory Visit (INDEPENDENT_AMBULATORY_CARE_PROVIDER_SITE_OTHER): Payer: Medicaid Other | Admitting: Nurse Practitioner

## 2020-10-24 ENCOUNTER — Ambulatory Visit: Payer: Medicaid Other

## 2020-10-24 DIAGNOSIS — Z3042 Encounter for surveillance of injectable contraceptive: Secondary | ICD-10-CM | POA: Diagnosis not present

## 2020-10-24 LAB — POCT URINE PREGNANCY: Preg Test, Ur: NEGATIVE

## 2020-10-24 MED ORDER — MEDROXYPROGESTERONE ACETATE 150 MG/ML IM SUSP
150.0000 mg | Freq: Once | INTRAMUSCULAR | Status: AC
  Administered 2020-10-24: 150 mg via INTRAMUSCULAR

## 2020-10-24 NOTE — Progress Notes (Signed)
Patient in for Depo injection. °

## 2020-10-25 ENCOUNTER — Encounter: Payer: Self-pay | Admitting: Nurse Practitioner

## 2020-10-25 ENCOUNTER — Ambulatory Visit: Payer: Medicaid Other | Admitting: Nurse Practitioner

## 2020-10-25 VITALS — BP 113/76 | HR 67 | Temp 97.7°F | Ht 67.0 in | Wt 123.0 lb

## 2020-10-25 DIAGNOSIS — M5442 Lumbago with sciatica, left side: Secondary | ICD-10-CM | POA: Diagnosis not present

## 2020-10-25 DIAGNOSIS — F32A Depression, unspecified: Secondary | ICD-10-CM | POA: Diagnosis not present

## 2020-10-25 DIAGNOSIS — G894 Chronic pain syndrome: Secondary | ICD-10-CM

## 2020-10-25 DIAGNOSIS — F419 Anxiety disorder, unspecified: Secondary | ICD-10-CM | POA: Diagnosis not present

## 2020-10-25 DIAGNOSIS — G8929 Other chronic pain: Secondary | ICD-10-CM
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
## 2020-10-25 NOTE — Patient Instructions (Signed)
Lumbosacral Radiculopathy Lumbosacral radiculopathy is a condition that involves the spinal nerves and nerve roots in the low back and bottom of the spine. The condition develops when these nerves and nerve roots move out of place or become inflamed andcause symptoms. What are the causes? This condition may be caused by: Pressure from a disk that bulges out of place (herniated disk). A disk is a plate of soft cartilage that separates bones in the spine. Disk changes that occur with age (disk degeneration). A narrowing of the bones of the lower back (spinal stenosis). A tumor. An infection. An injury that places sudden pressure on the disks that cushion the bones of your lower spine. What increases the risk? You are more likely to develop this condition if: You are a female who is 30-50 years old. You are a female who is 50-60 years old. You use improper technique when lifting things. You are overweight or live a sedentary lifestyle. You smoke. Your work requires frequent lifting. You do repetitive activities that strain the spine. What are the signs or symptoms? Symptoms of this condition include: Pain that goes down from your back into your legs (sciatica), usually on one side of the body. This is the most common symptom. The pain may be worse with sitting, coughing, or sneezing. Pain and numbness in your legs. Muscle weakness. Tingling. Loss of bladder control or bowel control. How is this diagnosed? This condition may be diagnosed based on: Your symptoms and medical history. A physical exam. If the pain is lasting, you may have tests, such as: MRI scan. X-ray. CT scan. A type of X-ray used to examine the spinal canal after injecting a dye into your spine (myelogram). A test to measure how electrical impulses move through a nerve (nerve conduction study). How is this treated? Treatment may depend on the cause of the condition and may include: Working with a physical  therapist. Taking pain medicine. Applying heat and ice to affected areas. Doing stretches to improve flexibility. Doing exercises to strengthen back muscles. Having chiropractic spinal manipulation. Using transcutaneous electrical nerve stimulation (TENS) therapy. Getting a steroid injection in the spine. In some cases, no treatment is needed. If the condition is long-lasting (chronic), or if symptoms are severe, treatment may involve surgery or lifestylechanges, such as following a weight-loss plan. Follow these instructions at home: Activity Avoid bending and other activities that make the problem worse. Maintain a proper position when standing or sitting: When standing, keep your upper back and neck straight, with your shoulders pulled back. Avoid slouching. When sitting, keep your back straight and relax your shoulders. Do not round your shoulders or pull them backward. Do not sit or stand in one place for long periods of time. Take brief periods of rest throughout the day. This will reduce your pain. It is usually better to rest by lying down or standing, not sitting. When you are resting for longer periods, mix in some mild activity or stretching between periods of rest. This will help to prevent stiffness and pain. Get regular exercise. Ask your health care provider what activities are safe for you. If you were shown how to do any exercises or stretches, do them as directed by your health care provider. Do not lift anything that is heavier than 10 lb (4.5 kg) or the limit that you are told by your health care provider. Always use proper lifting technique, which includes: Bending your knees. Keeping the load close to your body. Avoiding twisting. Managing pain   If directed, put ice on the affected area: Put ice in a plastic bag. Place a towel between your skin and the bag. Leave the ice on for 20 minutes, 2-3 times a day. If directed, apply heat to the affected area as often as told  by your health care provider. Use the heat source that your health care provider recommends, such as a moist heat pack or a heating pad. Place a towel between your skin and the heat source. Leave the heat on for 20-30 minutes. Remove the heat if your skin turns bright red. This is especially important if you are unable to feel pain, heat, or cold. You may have a greater risk of getting burned. Take over-the-counter and prescription medicines only as told by your health care provider. General instructions Sleep on a firm mattress in a comfortable position. Try lying on your side with your knees slightly bent. If you lie on your back, put a pillow under your knees. Do not drive or use heavy machinery while taking prescription pain medicine. If your health care provider prescribed a diet or exercise program, follow it as directed. Keep all follow-up visits as told by your health care provider. This is important. Contact a health care provider if: Your pain does not improve over time, even when taking pain medicines. Get help right away if: You develop severe pain. Your pain suddenly gets worse. You develop increasing weakness in your legs. You lose the ability to control your bladder or bowel. You have difficulty walking or balancing. You have a fever. Summary Lumbosacral radiculopathy is a condition that occurs when the spinal nerves and nerve roots in the lower part of the spine move out of place or become inflamed and cause symptoms. Symptoms include pain, numbness, and tingling that go down from your back into your legs (sciatica), muscle weakness, and loss of bladder control or bowel control. If directed, apply ice or heat to the affected area as told by your health care provider. Follow instructions about activity, rest, and proper lifting technique. This information is not intended to replace advice given to you by your health care provider. Make sure you discuss any questions you have  with your healthcare provider. Document Revised: 03/08/2020 Document Reviewed: 03/08/2020 Elsevier Patient Education  2022 [employer] Inc[employer]  

## 2020-10-27 DIAGNOSIS — H66009 Acute suppurative otitis media without spontaneous rupture of ear drum, unspecified ear: Secondary | ICD-10-CM | POA: Diagnosis not present

## 2020-10-30 ENCOUNTER — Telehealth: Payer: Self-pay | Admitting: Clinical

## 2020-10-30 NOTE — Telephone Encounter (Signed)
Integrated Behavioral Health Case Management Referral Note  10/30/2020 Name: Fiorela Pelzer MRN: [id number] DOB: 02-05-2001 Ledonna Dormer is a 20 y.o. year old female who sees Barbette Merino, NP for primary care. LCSW was consulted to assess patient's needs and assist the patient with Mental Health Counseling and Resources.  Interpreter: No.   Interpreter Name & Language: none  Assessment: Patient experiencing Mental Health Concerns . Received referral from PCP after last visit.   Intervention: CSW called patient today and patient's mother also on the call. Scheduled initial Integrated Behavioral Health (IBH) outpatient visit for 11/01/20.  Review of patient status, including review of consultants reports, relevant laboratory and other test results, and collaboration with appropriate care team members and the patient's provider was performed as part of comprehensive patient evaluation and provision of services.     Abigail Butts, LCSW [facility] [phone]

## 2020-11-01 ENCOUNTER — Institutional Professional Consult (permissible substitution): Payer: Self-pay | Admitting: Clinical

## 2020-11-05 ENCOUNTER — Other Ambulatory Visit: Payer: Self-pay | Admitting: Nurse Practitioner

## 2020-11-05 MED ORDER — DIAZEPAM 2 MG PO TABS: ORAL_TABLET | ORAL | 0 refills | Status: DC

## 2020-11-05 NOTE — Progress Notes (Signed)
[facility] Patient [facility] [address] [place], [place]  [address] Phone:  [phone]   Fax:  [phone]

## 2020-11-13 ENCOUNTER — Ambulatory Visit (HOSPITAL_COMMUNITY): Admission: RE | Admit: 2020-11-13 | Payer: Medicaid Other | Source: Ambulatory Visit

## 2020-11-19 ENCOUNTER — Ambulatory Visit: Payer: Medicaid Other | Admitting: Family Medicine

## 2020-11-19 ENCOUNTER — Ambulatory Visit: Payer: Medicaid Other | Admitting: Nurse Practitioner

## 2020-11-29 ENCOUNTER — Encounter: Payer: Self-pay | Admitting: Nurse Practitioner

## 2020-11-29 ENCOUNTER — Ambulatory Visit (INDEPENDENT_AMBULATORY_CARE_PROVIDER_SITE_OTHER): Payer: Medicaid Other | Admitting: Nurse Practitioner

## 2020-11-29 ENCOUNTER — Other Ambulatory Visit: Payer: Self-pay

## 2020-11-29 DIAGNOSIS — G8929 Other chronic pain: Secondary | ICD-10-CM | POA: Diagnosis not present

## 2020-11-29 DIAGNOSIS — F32A Depression, unspecified: Secondary | ICD-10-CM | POA: Diagnosis not present

## 2020-11-29 DIAGNOSIS — M5442 Lumbago with sciatica, left side: Secondary | ICD-10-CM | POA: Diagnosis not present
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
## 2020-11-29 NOTE — Patient Instructions (Addendum)
Imaging Scheduling  [phone]  Health Maintenance, Female Adopting a healthy lifestyle and getting preventive care are important in promoting health and wellness. Ask your health care provider about: The right schedule for you to have regular tests and exams. Things you can do on your own to prevent diseases and keep yourself healthy. What should I know about diet, weight, and exercise? Eat a healthy diet  Eat a diet that includes plenty of vegetables, fruits, low-fat dairy products, and lean protein. Do not eat a lot of foods that are high in solid fats, added sugars, or sodium.  Maintain a healthy weight Body mass index (BMI) is used to identify weight problems. It estimates body fat based on height and weight. Your health care provider can help determineyour BMI and help you achieve or maintain a healthy weight. Get regular exercise Get regular exercise. This is one of the most important things you can do for your health. Most adults should: Exercise for at least 150 minutes each week. The exercise should increase your heart rate and make you sweat (moderate-intensity exercise). Do strengthening exercises at least twice a week. This is in addition to the moderate-intensity exercise. Spend less time sitting. Even light physical activity can be beneficial. Watch cholesterol and blood lipids Have your blood tested for lipids and cholesterol at 22 years of age, then havethis test every 5 years. Have your cholesterol levels checked more often if: Your lipid or cholesterol levels are high. You are older than 22 years of age. You are at high risk for heart disease. What should I know about cancer screening? Depending on your health history and family history, you may need to have cancer screening at various ages. This may include screening for: Breast cancer. Cervical cancer. Colorectal cancer. Skin cancer. Lung cancer. What should I know about heart disease, diabetes, and high blood  pressure? Blood pressure and heart disease High blood pressure causes heart disease and increases the risk of stroke. This is more likely to develop in people who have high blood pressure readings, are of African descent, or are overweight. Have your blood pressure checked: Every 3-5 years if you are 34-43 years of age. Every year if you are 19 years old or older. Diabetes Have regular diabetes screenings. This checks your fasting blood sugar level. Have the screening done: Once every three years after age 2 if you are at a normal weight and have a low risk for diabetes. More often and at a younger age if you are overweight or have a high risk for diabetes. What should I know about preventing infection? Hepatitis B If you have a higher risk for hepatitis B, you should be screened for this virus. Talk with your health care provider to find out if you are at risk forhepatitis B infection. Hepatitis C Testing is recommended for: Everyone born from [birth year] through 1967. Anyone with known risk factors for hepatitis C. Sexually transmitted infections (STIs) Get screened for STIs, including gonorrhea and chlamydia, if: You are sexually active and are younger than 22 years of age. You are older than 22 years of age and your health care provider tells you that you are at risk for this type of infection. Your sexual activity has changed since you were last screened, and you are at increased risk for chlamydia or gonorrhea. Ask your health care provider if you are at risk. Ask your health care provider about whether you are at high risk for HIV. Your health care provider may recommend  a prescription medicine to help prevent HIV infection. If you choose to take medicine to prevent HIV, you should first get tested for HIV. You should then be tested every 3 months for as long as you are taking the medicine. Pregnancy If you are about to stop having your period (premenopausal) and you may become pregnant,  seek counseling before you get pregnant. Take 400 to 800 micrograms (mcg) of folic acid every day if you become pregnant. Ask for birth control (contraception) if you want to prevent pregnancy. Osteoporosis and menopause Osteoporosis is a disease in which the bones lose minerals and strength with aging. This can result in bone fractures. If you are 48 years old or older, or if you are at risk for osteoporosis and fractures, ask your health care provider if you should: Be screened for bone loss. Take a calcium or vitamin D supplement to lower your risk of fractures. Be given hormone replacement therapy (HRT) to treat symptoms of menopause. Follow these instructions at home: Lifestyle Do not use any products that contain nicotine or tobacco, such as cigarettes, e-cigarettes, and chewing tobacco. If you need help quitting, ask your health care provider. Do not use street drugs. Do not share needles. Ask your health care provider for help if you need support or information about quitting drugs. Alcohol use Do not drink alcohol if: Your health care provider tells you not to drink. You are pregnant, may be pregnant, or are planning to become pregnant. If you drink alcohol: Limit how much you use to 0-1 drink a day. Limit intake if you are breastfeeding. Be aware of how much alcohol is in your drink. In the [place] one drink equals one 12 oz bottle of beer (355 mL), one 5 oz glass of wine (148 mL), or one 1 oz glass of hard liquor (44 mL). General instructions Schedule regular health, dental, and eye exams. Stay current with your vaccines. Tell your health care provider if: You often feel depressed. You have ever been abused or do not feel safe at home. Summary Adopting a healthy lifestyle and getting preventive care are important in promoting health and wellness. Follow your health care provider's instructions about healthy diet, exercising, and getting tested or screened for diseases. Follow  your health care provider's instructions on monitoring your cholesterol and blood pressure. This information is not intended to replace advice given to you by your health care provider. Make sure you discuss any questions you have with your healthcare provider. Document Revised: 04/21/2018 Document Reviewed: 04/21/2018 Elsevier Patient Education  2022 [employer]  Pap Test Why am I having this test? A Pap test, also called a Pap smear, is a screening test to check for signs of: Cancer of the vagina, cervix, and uterus. The cervix is the lower part of the uterus that opens into the vagina. Infection. Changes that may be a sign that cancer is developing (precancerous changes). Women need this test on a regular basis. In general, you should have a Pap test every 3 years until you reach menopause or age 71. Women aged 30-60 may choose to have their Pap test done at the same time as an HPV (human papillomavirus) test every 5 years (instead of every 3 years). Your health care provider may recommend having Pap tests more or less oftendepending on your medical conditions and past Pap test results. What kind of sample is taken?  Your health care provider will collect a sample of cells from the surface of your cervix.  This will be done using a small cotton swab, plastic spatula, or brush. This sample is often collected during a pelvic exam, when you are lying on your back on an exam table with feet in footrests (stirrups). In some cases, fluids (secretions) from the cervix or vagina may also be collected. How do I prepare for this test? Be aware of where you are in your menstrual cycle. If you are menstruating on the day of the test, you may be asked to reschedule. You may need to reschedule if you have a known vaginal infection on the day of the test. Follow instructions from your health care provider about: Changing or stopping your regular medicines. Some medicines can cause abnormal test results,  such as digitalis and tetracycline. Avoiding douching or taking a bath the day before or the day of the test. Tell a health care provider about: Any allergies you have. All medicines you are taking, including vitamins, herbs, eye drops, creams, and over-the-counter medicines. Any blood disorders you have. Any surgeries you have had. Any medical conditions you have. Whether you are pregnant or may be pregnant. How are the results reported? Your test results will be reported as either abnormal or normal. A false-positive result can occur. A false positive is incorrect because itmeans that a condition is present when it is not. A false-negative result can occur. A false negative is incorrect because itmeans that a condition is not present when it is. What do the results mean? A normal test result means that you do not have signs of cancer of the vagina,cervix, or uterus. An abnormal result may mean that you have: Cancer. A Pap test by itself is not enough to diagnose cancer. You will have more tests done in this case. Precancerous changes in your vagina, cervix, or uterus. Inflammation of the cervix. An STD (sexually transmitted disease). A fungal infection. A parasite infection. Talk with your health care provider about what your results mean. Questions to ask your health care provider Ask your health care provider, or the department that is doing the test: When will my results be ready? How will I get my results? What are my treatment options? What other tests do I need? What are my next steps? Summary In general, women should have a Pap test every 3 years until they reach menopause or age 73. Your health care provider will collect a sample of cells from the surface of your cervix. This will be done using a small cotton swab, plastic spatula, or brush. In some cases, fluids (secretions) from the cervix or vagina may also be collected. This information is not intended to replace  advice given to you by your health care provider. Make sure you discuss any questions you have with your healthcare provider. Document Revised: 04/10/2020 Document Reviewed: 12/30/2019 Elsevier Patient Education  2022 [employer]  Breast Self-Awareness Breast self-awareness is knowing how your breasts look and feel. Doing breast self-awareness is important. It allows you to catch a breast problem early while it is still small and can be treated. All women should do breast self-awareness, including women who have had breast implants. Tell your doctorif you notice a change in your breasts. What you need: A mirror. A well-lit room. How to do a breast self-exam A breast self-exam is one way to learn what is normal for your breasts and tocheck for changes. To do a breast self-exam: Look for changes  Take off all the clothes above your waist. Stand in front of  a mirror in a room with good lighting. Put your hands on your hips. Push your hands down. Look at your breasts and nipples in the mirror to see if one breast or nipple looks different from the other. Check to see if: The shape of one breast is different. The size of one breast is different. There are wrinkles, dips, and bumps in one breast and not the other. Look at each breast for changes in the skin, such as: Redness. Scaly areas. Look for changes in your nipples, such as: Liquid around the nipples. Bleeding. Dimpling. Redness. A change in where the nipples are.  Feel for changes  Lie on your back on the floor. Feel each breast. To do this, follow these steps: Pick a breast to feel. Put the arm closest to that breast above your head. Use your other arm to feel the nipple area of your breast. Feel the area with the pads of your three middle fingers by making small circles with your fingers. For the first circle, press lightly. For the second circle, press harder. For the third circle, press even harder. Keep making circles  with your fingers at the different pressures as you move down your breast. Stop when you feel your ribs. Move your fingers a little toward the center of your body. Start making circles with your fingers again, this time going up until you reach your collarbone. Keep making up-and-down circles until you reach your armpit. Remember to keep using the three pressures. Feel the other breast in the same way. Sit or stand in the tub or shower. With soapy water on your skin, feel each breast the same way you did in step 2 when you were lying on the floor.  Write down what you find Writing down what you find can help you remember what to tell your doctor. Write down: What is normal for each breast. Any changes you find in each breast, including: The kind of changes you find. Whether you have pain. Size and location of any lumps. When you last had your menstrual period. General tips Check your breasts every month. If you are breastfeeding, the best time to check your breasts is after you feed your baby or after you use a breast pump. If you get menstrual periods, the best time to check your breasts is 5-7 days after your menstrual period is over. With time, you will become comfortable with the self-exam, and you will begin to know if there are changes in your breasts. Contact a doctor if you: See a change in the shape or size of your breasts or nipples. See a change in the skin of your breast or nipples, such as red or scaly skin. Have fluid coming from your nipples that is not normal. Find a lump or thick area that was not there before. Have pain in your breasts. Have any concerns about your breast health. Summary Breast self-awareness includes looking for changes in your breasts, as well as feeling for changes within your breasts. Breast self-awareness should be done in front of a mirror in a well-lit room. You should check your breasts every month. If you get menstrual periods, the best time  to check your breasts is 5-7 days after your menstrual period is over. Let your doctor know of any changes you see in your breasts, including changes in size, changes on the skin, pain or tenderness, or fluid from your nipples that is not normal. This information is not intended to replace advice  given to you by your health care provider. Make sure you discuss any questions you have with your healthcare provider. Document Revised: 12/15/2017 Document Reviewed: 12/15/2017 Elsevier Patient Education  2022 [employer].

## 2020-11-30 DIAGNOSIS — H903 Sensorineural hearing loss, bilateral: Secondary | ICD-10-CM | POA: Diagnosis not present

## 2020-11-30 DIAGNOSIS — H9311 Tinnitus, right ear: Secondary | ICD-10-CM | POA: Diagnosis not present

## 2020-12-05 ENCOUNTER — Ambulatory Visit (HOSPITAL_COMMUNITY)
Admission: RE | Admit: 2020-12-05 | Discharge: 2020-12-05 | Disposition: A | Discharge status: Home / Self Care | Payer: Medicaid Other | Source: Ambulatory Visit | Attending: Nurse Practitioner | Admitting: Nurse Practitioner

## 2020-12-05 ENCOUNTER — Other Ambulatory Visit: Payer: Self-pay

## 2020-12-05 DIAGNOSIS — M5442 Lumbago with sciatica, left side: Secondary | ICD-10-CM | POA: Insufficient documentation

## 2020-12-05 DIAGNOSIS — M545 Low back pain, unspecified: Secondary | ICD-10-CM | POA: Diagnosis not present

## 2020-12-05 DIAGNOSIS — N390 Urinary tract infection, site not specified: Secondary | ICD-10-CM | POA: Diagnosis not present

## 2020-12-05 DIAGNOSIS — G8929 Other chronic pain: Secondary | ICD-10-CM | POA: Diagnosis not present

## 2020-12-05 MED ORDER — GADOBUTROL 1 MMOL/ML IV SOLN
5.5000 mL | Freq: Once | INTRAVENOUS | Status: AC | PRN
  Administered 2020-12-05: 5.5 mL via INTRAVENOUS

## 2020-12-10 ENCOUNTER — Telehealth (INDEPENDENT_AMBULATORY_CARE_PROVIDER_SITE_OTHER): Payer: Medicaid Other | Admitting: Nurse Practitioner

## 2020-12-10 ENCOUNTER — Other Ambulatory Visit: Payer: Self-pay

## 2020-12-10 DIAGNOSIS — M5442 Lumbago with sciatica, left side: Secondary | ICD-10-CM

## 2020-12-10 DIAGNOSIS — M5127 Other intervertebral disc displacement, lumbosacral region: Secondary | ICD-10-CM

## 2020-12-10 DIAGNOSIS — G8929 Other chronic pain: Secondary | ICD-10-CM | POA: Diagnosis not present

## 2020-12-10 DIAGNOSIS — M47817 Spondylosis without myelopathy or radiculopathy, lumbosacral region: Secondary | ICD-10-CM | POA: Diagnosis not present

## 2020-12-10 NOTE — Patient Instructions (Signed)
Facet Syndrome  Facet syndrome is a condition in which joints (facet joints) that connect the bones of the spine (vertebrae) become damaged. Facet joints help the spine move, and they wear down (degenerate) or become inflamed as a person gets older. This can cause pain and stiffness in the neck (cervical facet syndrome) or in the lower back (lumbar facet syndrome). When a facet joint becomes damaged, a vertebra may slip forward, out of its normal place in the spine. Damage to a facet joint can also damage nerves near the spine, which can cause tingling or weakness in the arms or legs. Facet syndrome can make it difficult to turn the head or bend backward without pain.This condition usually gets worse over time. What are the causes? Common causes of this condition include: Age-related inflammation of the facet joints (arthritis) that may create extra bone on the joint surface (bone spurs). Age-related decrease in space between the vertebrae (disk degeneration and cartilage degeneration). Repetitive stress on the spine, such as repetitive twisting of the back. Injury to the back or neck. Poor posture. Being overweight or obese. What increases the risk? The following factors may make you more likely to develop this condition: Playing contact sports. Doing activities or sports that involve repetitive twisting motions or repetitive heavy lifting. Having poor back strength and flexibility. Having another back or spine condition, such as scoliosis. What are the signs or symptoms? Symptoms of facet syndrome may include: An ache in the neck or lower back. This may get worse when you twist or arch your back, or when you look up. Stiffness in the neck or lower back. Numbness, tingling, or weakness in the arms or legs. Symptoms of cervical facet syndrome may include: Headache. Pain in the back of the head. Pain in the shoulder blades. Symptoms of lumbar facet syndrome may include pain in any of the  following areas: Groin. Thighs. Lower back. Buttocks. Hips. How is this diagnosed? This condition may be diagnosed based on: Your symptoms. Your medical history. A physical exam. Imaging tests, such as: X-rays. MRI. A procedure in which medicines to numb the area (local anesthetic) and medicines to reduce inflammation (steroids) are injected into your affected joint (facet joint block). This helps to diagnose and may relieve pain. How is this treated? This condition may be treated by: Stopping or modifying activities that make your condition worse. Taking medicines that help reduce pain and inflammation. Having steroid injections to help reduce severe pain. Doing physical therapy. Following a weight-control plan. Having a procedure called radiofrequency ablation. This is a surgical procedure that uses high-frequency radio waves to block signals from affected nerves. Having surgery to stabilize your spine or to take pressure off your nerves. This is rare. Follow these instructions at home: Medicines Take over-the-counter and prescription medicines only as told by your health care provider. Ask your health care provider if the medicine prescribed to you: Requires you to avoid driving or using heavy machinery. Can cause constipation. You may need to take actions to prevent or treat constipation, such as: Drink enough fluid to keep your urine pale yellow. Take over-the-counter or prescription medicines. Eat foods that are high in fiber, such as beans, whole grains, and fresh fruits and vegetables. Limit foods that are high in fat and processed sugars, such as fried or sweet foods. Activity Rest your neck and back as told by your health care provider. Return to your normal activities as told by your health care provider. Ask your health care provider  what activities are safe for you. If physical therapy was prescribed, do exercises as told by your health care provider. General  instructions  Do not use any products that contain nicotine or tobacco, such as cigarettes, e-cigarettes, and chewing tobacco. These can delay healing. If you need help quitting, ask your health care provider. Use good posture throughout your daily activities. Good posture means that your spine is in its natural S-curve position (your spine is neutral), your shoulders are pulled back slightly, and your head is not forward. Maintain a healthy weight. Follow instructions from your health care provider for weight control. These may include dietary restrictions. Keep all follow-up visits as told by your health care provider. This is important.  Contact a health care provider if you have: Symptoms that get worse or do not improve in 2-4 weeks of treatment. New symptoms. Get help right away if you: Have numbness or weakness in any part of your body. Lose control over your bladder or bowel functions. Summary Facet syndrome is a condition in which joints (facet joints) that connect the bones of the spine (vertebrae) become damaged. This can cause pain and stiffness in the neck (cervical facet syndrome) or in the lower back (lumbar facet syndrome). Rest your neck and back as told by your health care provider. If physical therapy was prescribed, do exercises as told by your health care provider. Take over-the-counter and prescription medicines only as told by your health care provider. Contact a health care provider if you have symptoms that get worse or do not improve in 2-4 weeks of treatment, or if you have new symptoms. This information is not intended to replace advice given to you by your health care provider. Make sure you discuss any questions you have with your healthcare provider. Document Revised: 04/06/2018 Document Reviewed: 04/11/2018 Elsevier Patient Education  2022 [employer] Epidural Steroid Injection  An epidural steroid injection is a shot of steroid medicine and numbing medicine  that is given into the space between the spinal cord and the bones of the back (epidural space). The shot helps relieve pain caused by an irritated or swollen nerve root. The amount of pain relief you get from the injection depends on what is causing the nerve to be swollen and irritated, and how long your pain lasts. You are more likely to benefit from this injection if your pain is strong and comes on suddenly rather than if you have had long-term (chronic) pain. Tell a health care provider about: Any allergies you have. All medicines you are taking, including vitamins, herbs, eye drops, creams, and over-the-counter medicines. Any problems you or family members have had with anesthetic medicines. Any blood disorders you have. Any surgeries you have had. Any medical conditions you have. Whether you are pregnant or may be pregnant. What are the risks? Generally, this is a safe procedure. However, problems may occur, including: Headache. Bleeding. Infection. Allergic reaction to medicines. Nerve damage. What happens before the procedure? Staying hydrated Follow instructions from your health care provider about hydration, which may include: Up to 2 hours before the procedure - you may continue to drink clear liquids, such as water, clear fruit juice, black coffee, and plain tea. Eating and drinking restrictions Follow instructions from your health care provider about eating and drinking, which may include: 8 hours before the procedure - stop eating heavy meals or foods, such as meat, fried foods, or fatty foods. 6 hours before the procedure - stop eating light meals or foods,  such as toast or cereal. 6 hours before the procedure - stop drinking milk or drinks that contain milk. 2 hours before the procedure - stop drinking clear liquids. Medicines You may be given medicines to lower anxiety. Ask your health care provider about: Changing or stopping your regular medicines. This is  especially important if you are taking diabetes medicines or blood thinners. Taking medicines such as aspirin and ibuprofen. These medicines can thin your blood. Do not take these medicines unless your health care provider tells you to take them. Taking over-the-counter medicines, vitamins, herbs, and supplements. General instructions Ask your health care provider what steps will be taken to prevent infection. Plan to have a responsible adult take you home from the hospital or clinic. If you will be going home right after the procedure, plan to have a responsible adult care for you for the time you are told. This is important. What happens during the procedure? An IV will be inserted into one of your veins. You will be given one or more of the following: A medicine to help you relax (sedative). A medicine to numb the area (local anesthetic). You will be asked to lie on your abdomen or sit. The injection site will be cleaned. A needle will be inserted through your skin into the epidural space. This may cause you some discomfort. An X-ray machine will be used to guide the needle as close as possible to the affected nerve. A steroid medicine and a local anesthetic will be injected into the epidural space. The needle and IV will be removed. A bandage (dressing) will be put over the injection site. The procedure may vary among health care providers and hospitals. What can I expect after the procedure? Your blood pressure, heart rate, breathing rate, and blood oxygen level will be monitored until you leave the hospital or clinic. Your arm or leg may feel weak or numb for a few hours. The injection site may feel sore. Follow these instructions at home: Injection site care You may remove the bandage (dressing) after 24 hours. Check your injection site every day for signs of infection. Check for: Redness, swelling, or pain. Fluid or blood. Warmth. Pus or a bad smell. Managing pain, stiffness,  and swelling For 24 hours after the procedure: Avoid using heat on the injection site. Do not take baths, swim, or use a hot tub until your health care provider approves. Ask your health care provider if you may take showers. You may only be allowed to take sponge baths. If directed, put ice on the injection site. To do this: Put ice in a plastic bag. Place a towel between your skin and the bag. Leave the ice on for 20 minutes, 2-3 times a day.  Activity If you were given a sedative during the procedure, it can affect you for several hours. Do not drive or operate machinery until your health care provider says that it is safe. Return to your normal activities as told by your health care provider. Ask your health care provider what activities are safe for you. General instructions Take over-the-counter and prescription medicines only as told by your health care provider. Drink enough fluid to keep your urine pale yellow. Keep all follow-up visits as told by your health care provider. This is important. Contact a health care provider if: You have any of these signs of infection: Redness, swelling, or pain around your injection site. Fluid or blood coming from your injection site. Warmth coming from your injection  site. Pus or a bad smell coming from your injection site. A fever. You continue to have pain and soreness around the injection site, even after taking over-the-counter pain medicine. You have severe, sudden, or lasting nausea or vomiting. Get help right away if: You have severe pain at the injection site that is not relieved by medicines. You develop a severe headache or a stiff neck. You become sensitive to light. You have any new numbness or weakness in your legs or arms. You lose control of your bladder or bowel movements. You have trouble breathing. Summary An epidural steroid injection is a shot of steroid medicine and numbing medicine that is given into the epidural  space. The shot helps relieve pain caused by an irritated or swollen nerve root. You are more likely to benefit from this injection if your pain is strong and comes on suddenly rather than if you have had chronic pain. This information is not intended to replace advice given to you by your health care provider. Make sure you discuss any questions you have with your healthcare provider. Document Revised: 08/26/2019 Document Reviewed: 11/08/2018 Elsevier Patient Education  2022 [employer] Radicular Pain Radicular pain is a type of pain that spreads from your back or neck along a spinal nerve. Spinal nerves are nerves that leave the spinal cord and go to the muscles. Radicular pain is sometimes called radiculopathy, radiculitis, or a pinched nerve. When you have this type of pain, you may also have weakness, numbness, or tingling in the area of your body that is supplied by the nerve. The pain may feel sharp and burning. Depending on which spinal nerve is affected, the pain may occur in the: Neck area (cervical radicular pain). You may also feel pain, numbness, weakness, or tingling in the arms. Mid-spine area (thoracic radicular pain). You would feel this pain in the back and chest. This type is rare. Lower back area (lumbar radicular pain). You would feel this pain as low back pain. You may feel pain, numbness, weakness, or tingling in the buttocks or legs. Sciatica is a type of lumbar radicular pain that shoots down the back of the leg. Radicular pain occurs when one of the spinal nerves becomes irritated or squeezed (compressed). It is often caused by something pushing on a spinal nerve, such as one of the bones of the spine (vertebrae) or one of the round cushions between vertebrae (intervertebral disks). This can result from: An injury. Wear and tear or aging of a disk. The growth of a bone spur that pushes on the nerve. Radicular pain often goes away when you follow instructions from your  healthcare provider for relieving pain at home. Follow these instructions at home: Managing pain     If directed, put ice on the affected area: Put ice in a plastic bag. Place a towel between your skin and the bag. Leave the ice on for 20 minutes, 2-3 times a day. If directed, apply heat to the affected area as often as told by your health care provider. Use the heat source that your health care provider recommends, such as a moist heat pack or a heating pad. Place a towel between your skin and the heat source. Leave the heat on for 20-30 minutes. Remove the heat if your skin turns bright red. This is especially important if you are unable to feel pain, heat, or cold. You may have a greater risk of getting burned. Activity  Do not sit or rest in bed  for long periods of time. Try to stay as active as possible. Ask your health care provider what type of exercise or activity is best for you. Avoid activities that make your pain worse, such as bending and lifting. Do not lift anything that is heavier than 10 lb (4.5 kg), or the limit that you are told, until your health care provider says that it is safe. Practice using proper technique when lifting items. Proper lifting technique involves bending your knees and rising up. Do strength and range-of-motion exercises only as told by your health care provider or physical therapist.  General instructions Take over-the-counter and prescription medicines only as told by your health care provider. Pay attention to any changes in your symptoms. Keep all follow-up visits as told by your health care provider. This is important. Your health care provider may send you to a physical therapist to help with this pain. Contact a health care provider if: Your pain and other symptoms get worse. Your pain medicine is not helping. Your pain has not improved after a few weeks of home care. You have a fever. Get help right away if: You have severe pain,  weakness, or numbness. You have difficulty with bladder or bowel control. Summary Radicular pain is a type of pain that spreads from your back or neck along a spinal nerve. When you have radicular pain, you may also have weakness, numbness, or tingling in the area of your body that is supplied by the nerve. The pain may feel sharp or burning. Radicular pain may be treated with ice, heat, medicines, or physical therapy. This information is not intended to replace advice given to you by your health care provider. Make sure you discuss any questions you have with your healthcare provider. Document Revised: 11/10/2017 Document Reviewed: 11/10/2017 Elsevier Patient Education  2022 [employer]
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
## 2020-12-18 ENCOUNTER — Ambulatory Visit (HOSPITAL_COMMUNITY): Payer: Medicaid Other | Admitting: Physician Assistant

## 2020-12-24 ENCOUNTER — Other Ambulatory Visit: Payer: Self-pay | Admitting: Nurse Practitioner

## 2020-12-24 DIAGNOSIS — G8929 Other chronic pain: Secondary | ICD-10-CM

## 2020-12-24 DIAGNOSIS — M5127 Other intervertebral disc displacement, lumbosacral region: Secondary | ICD-10-CM

## 2020-12-24 DIAGNOSIS — M47817 Spondylosis without myelopathy or radiculopathy, lumbosacral region: Secondary | ICD-10-CM

## 2020-12-24 NOTE — Progress Notes (Signed)
   [facility] [address] [place], [place]  [address] Phone:  [phone]   Fax:  [phone] 

## 2020-12-28 ENCOUNTER — Ambulatory Visit (HOSPITAL_COMMUNITY): Payer: Medicaid Other | Admitting: Clinical

## 2020-12-28 ENCOUNTER — Telehealth (HOSPITAL_COMMUNITY): Payer: Self-pay | Admitting: Clinical

## 2020-12-28 NOTE — Telephone Encounter (Signed)
Therapist made contact with the client via MyChart for the scheduled virtual visit for a new patient assessment. Client reported she was referred by her PCP due to back pain. Therapist asked the client if she has mental health symptoms as a result of her physical symptoms. Client declined having mental health symptoms. Client reported she was unaware that this appointment was for mental health services. Therapist instructed her to call back at a later time if she needs mental health treatment. Client was informed of crisis services. Client reported no concerns at this time.

## 2021-01-02 ENCOUNTER — Ambulatory Visit (HOSPITAL_COMMUNITY): Payer: Self-pay | Admitting: Psychiatry

## 2021-01-04 ENCOUNTER — Ambulatory Visit: Payer: Medicaid Other | Attending: Nurse Practitioner | Admitting: Rehabilitative and Restorative Service Providers"

## 2021-01-10 ENCOUNTER — Ambulatory Visit: Payer: Medicaid Other | Attending: Nurse Practitioner

## 2021-01-10 ENCOUNTER — Other Ambulatory Visit: Payer: Self-pay

## 2021-01-10 DIAGNOSIS — M6281 Muscle weakness (generalized): Secondary | ICD-10-CM | POA: Insufficient documentation

## 2021-01-10 DIAGNOSIS — R293 Abnormal posture: Secondary | ICD-10-CM | POA: Diagnosis not present

## 2021-01-10 DIAGNOSIS — S8012XA Contusion of left lower leg, initial encounter: Secondary | ICD-10-CM | POA: Insufficient documentation

## 2021-01-10 DIAGNOSIS — M545 Low back pain, unspecified: Secondary | ICD-10-CM | POA: Diagnosis not present

## 2021-01-10 DIAGNOSIS — S8002XA Contusion of left knee, initial encounter: Secondary | ICD-10-CM | POA: Diagnosis not present
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
## 2021-01-10 NOTE — Patient Instructions (Signed)
Access Code: [email or web address] URL: [email or web address] Date: 01/10/2021 Prepared by: Claude Manges  Exercises Prone Quadriceps Stretch with Strap - 1 x daily - 7 x weekly - 3 sets - 30 seconds hold Supine Hamstring Stretch with Strap - 1 x daily - 7 x weekly - 3 sets - 30 seconds hold Supine Piriformis Stretch - 1 x daily - 7 x weekly - 4 sets - 30 seconds hold Supine Dead Bug with Leg Extension - 1 x daily - 7 x weekly - 2 sets - 10 reps Supine Bridge with Resistance Band - 1 x daily - 7 x weekly - 3 sets - 10 reps Seated Pelvic Tilt - 1 x daily - 7 x weekly - 2 sets - 10 reps

## 2021-01-17 ENCOUNTER — Ambulatory Visit: Payer: Medicaid Other | Admitting: Physical Therapy

## 2021-01-22 ENCOUNTER — Ambulatory Visit: Payer: Medicaid Other | Admitting: Physical Therapy

## 2021-01-22 ENCOUNTER — Other Ambulatory Visit: Payer: Self-pay

## 2021-01-22 ENCOUNTER — Encounter: Payer: Self-pay | Admitting: Physical Therapy

## 2021-01-22 DIAGNOSIS — M545 Low back pain, unspecified: Secondary | ICD-10-CM

## 2021-01-22 DIAGNOSIS — M6281 Muscle weakness (generalized): Secondary | ICD-10-CM | POA: Diagnosis not present

## 2021-01-22 DIAGNOSIS — S8012XA Contusion of left lower leg, initial encounter: Secondary | ICD-10-CM

## 2021-01-22 DIAGNOSIS — R293 Abnormal posture: Secondary | ICD-10-CM

## 2021-01-22 DIAGNOSIS — S8002XA Contusion of left knee, initial encounter: Secondary | ICD-10-CM
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
## 2021-01-24 ENCOUNTER — Ambulatory Visit (INDEPENDENT_AMBULATORY_CARE_PROVIDER_SITE_OTHER): Payer: Medicaid Other | Admitting: Nurse Practitioner

## 2021-01-24 ENCOUNTER — Encounter: Payer: Self-pay | Admitting: Physical Therapy

## 2021-01-24 ENCOUNTER — Ambulatory Visit: Payer: Medicaid Other | Admitting: Physical Therapy

## 2021-01-24 ENCOUNTER — Other Ambulatory Visit: Payer: Self-pay

## 2021-01-24 DIAGNOSIS — Z3042 Encounter for surveillance of injectable contraceptive: Secondary | ICD-10-CM | POA: Diagnosis not present

## 2021-01-24 DIAGNOSIS — R293 Abnormal posture: Secondary | ICD-10-CM

## 2021-01-24 DIAGNOSIS — M545 Low back pain, unspecified: Secondary | ICD-10-CM

## 2021-01-24 DIAGNOSIS — S8012XA Contusion of left lower leg, initial encounter: Secondary | ICD-10-CM | POA: Diagnosis not present

## 2021-01-24 DIAGNOSIS — S8002XA Contusion of left knee, initial encounter: Secondary | ICD-10-CM

## 2021-01-24 DIAGNOSIS — M6281 Muscle weakness (generalized): Secondary | ICD-10-CM

## 2021-01-24 LAB — POCT URINE PREGNANCY: Preg Test, Ur: NEGATIVE

## 2021-01-24 MED ORDER — MEDROXYPROGESTERONE ACETATE 150 MG/ML IM SUSP
150.0000 mg | Freq: Once | INTRAMUSCULAR | Status: AC
  Administered 2021-01-24: 150 mg via INTRAMUSCULAR
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
## 2021-01-24 NOTE — Progress Notes (Signed)
Patient in office for depo.

## 2021-01-29 ENCOUNTER — Ambulatory Visit: Payer: Medicaid Other | Admitting: Physical Therapy

## 2021-01-31 ENCOUNTER — Ambulatory Visit: Payer: Medicaid Other | Admitting: Physical Therapy

## 2021-02-05 ENCOUNTER — Ambulatory Visit: Payer: Medicaid Other | Admitting: Physical Therapy

## 2021-02-05 ENCOUNTER — Encounter: Payer: Self-pay | Admitting: Physical Therapy

## 2021-02-05 ENCOUNTER — Other Ambulatory Visit: Payer: Self-pay

## 2021-02-05 DIAGNOSIS — S8012XA Contusion of left lower leg, initial encounter: Secondary | ICD-10-CM

## 2021-02-05 DIAGNOSIS — M545 Low back pain, unspecified: Secondary | ICD-10-CM

## 2021-02-05 DIAGNOSIS — R293 Abnormal posture: Secondary | ICD-10-CM | POA: Diagnosis not present

## 2021-02-05 DIAGNOSIS — S8002XA Contusion of left knee, initial encounter: Secondary | ICD-10-CM | POA: Diagnosis not present

## 2021-02-05 DIAGNOSIS — M6281 Muscle weakness (generalized): Secondary | ICD-10-CM

## 2021-02-05 NOTE — Patient Instructions (Signed)
Access Code: [email or web address] URL: [email or web address] Date: 02/05/2021 Prepared by: Georgina Peer  Exercises Lateral Step Down - 2 x daily - 7 x weekly - 2 sets - 10 reps Forward Step Down - 2 x daily - 7 x weekly - 2 sets - 10 reps Side Stepping with Resistance at Ankles - 2 x daily - 7 x weekly - 2 sets - 10 reps Squat with Chair Touch - 2 x daily - 7 x weekly - 2 sets - 10 reps
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
## 2021-02-07 ENCOUNTER — Ambulatory Visit: Payer: Medicaid Other | Admitting: Physical Therapy

## 2021-02-13 ENCOUNTER — Other Ambulatory Visit: Payer: Self-pay

## 2021-02-13 ENCOUNTER — Encounter: Payer: Self-pay | Admitting: Physical Therapy

## 2021-02-13 ENCOUNTER — Ambulatory Visit: Payer: Medicaid Other | Attending: Nurse Practitioner | Admitting: Physical Therapy

## 2021-02-13 DIAGNOSIS — R293 Abnormal posture: Secondary | ICD-10-CM | POA: Insufficient documentation

## 2021-02-13 DIAGNOSIS — M545 Low back pain, unspecified: Secondary | ICD-10-CM | POA: Diagnosis not present

## 2021-02-13 DIAGNOSIS — M6281 Muscle weakness (generalized): Secondary | ICD-10-CM | POA: Insufficient documentation

## 2021-02-13 DIAGNOSIS — S8002XA Contusion of left knee, initial encounter: Secondary | ICD-10-CM | POA: Diagnosis not present

## 2021-02-13 DIAGNOSIS — S8012XA Contusion of left lower leg, initial encounter: Secondary | ICD-10-CM | POA: Insufficient documentation
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
## 2021-03-04 ENCOUNTER — Ambulatory Visit: Payer: Self-pay | Admitting: Nurse Practitioner

## 2021-03-04 DIAGNOSIS — H6983 Other specified disorders of Eustachian tube, bilateral: Secondary | ICD-10-CM | POA: Diagnosis not present

## 2021-03-04 DIAGNOSIS — H903 Sensorineural hearing loss, bilateral: Secondary | ICD-10-CM | POA: Diagnosis not present

## 2021-03-06 ENCOUNTER — Ambulatory Visit: Payer: Self-pay | Admitting: Nurse Practitioner

## 2021-03-26 DIAGNOSIS — H5213 Myopia, bilateral: Secondary | ICD-10-CM | POA: Diagnosis not present

## 2021-04-08 DIAGNOSIS — H52223 Regular astigmatism, bilateral: Secondary | ICD-10-CM | POA: Diagnosis not present

## 2021-04-08 DIAGNOSIS — H5213 Myopia, bilateral: Secondary | ICD-10-CM | POA: Diagnosis not present

## 2021-05-01 DIAGNOSIS — R1031 Right lower quadrant pain: Secondary | ICD-10-CM | POA: Diagnosis not present

## 2021-05-01 DIAGNOSIS — R112 Nausea with vomiting, unspecified: Secondary | ICD-10-CM | POA: Diagnosis not present

## 2021-05-01 DIAGNOSIS — Z3202 Encounter for pregnancy test, result negative: Secondary | ICD-10-CM | POA: Diagnosis not present

## 2021-05-02 ENCOUNTER — Other Ambulatory Visit: Payer: Self-pay

## 2021-05-02 ENCOUNTER — Encounter: Payer: Self-pay | Admitting: Nurse Practitioner

## 2021-05-02 ENCOUNTER — Ambulatory Visit (INDEPENDENT_AMBULATORY_CARE_PROVIDER_SITE_OTHER): Payer: Medicaid Other | Admitting: Nurse Practitioner

## 2021-05-02 VITALS — BP 109/66 | HR 78 | Temp 98.8°F | Ht 67.0 in | Wt 119.5 lb

## 2021-05-02 DIAGNOSIS — F32A Depression, unspecified: Secondary | ICD-10-CM | POA: Diagnosis not present

## 2021-05-02 DIAGNOSIS — Z3042 Encounter for surveillance of injectable contraceptive: Secondary | ICD-10-CM | POA: Diagnosis not present

## 2021-05-02 DIAGNOSIS — R1030 Lower abdominal pain, unspecified: Secondary | ICD-10-CM | POA: Diagnosis not present

## 2021-05-02 MED ORDER — MIRTAZAPINE 7.5 MG PO TABS: 7.5000 mg | ORAL_TABLET | Freq: Every day | ORAL | 5 refills | Status: AC
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
## 2021-05-03 ENCOUNTER — Ambulatory Visit: Payer: Self-pay

## 2021-05-03 DIAGNOSIS — R1030 Lower abdominal pain, unspecified: Secondary | ICD-10-CM | POA: Diagnosis not present

## 2021-05-04 LAB — CBC WITH DIFFERENTIAL/PLATELET
Basophils Absolute: 0 10*3/uL (ref 0.0–0.2)
Basos: 0 %
EOS (ABSOLUTE): 0.1 10*3/uL (ref 0.0–0.4)
Eos: 3 %
Hematocrit: 42.2 % (ref 34.0–46.6)
Hemoglobin: 14.5 g/dL (ref 11.1–15.9)
Immature Grans (Abs): 0 10*3/uL (ref 0.0–0.1)
Immature Granulocytes: 0 %
Lymphocytes Absolute: 1.1 10*3/uL (ref 0.7–3.1)
Lymphs: 51 %
MCH: 31 pg (ref 26.6–33.0)
MCHC: 34.4 g/dL (ref 31.5–35.7)
MCV: 90 fL (ref 79–97)
Monocytes Absolute: 0.2 10*3/uL (ref 0.1–0.9)
Monocytes: 10 %
Neutrophils Absolute: 0.8 10*3/uL — ABNORMAL LOW (ref 1.4–7.0)
Neutrophils: 36 %
Platelets: 208 10*3/uL (ref 150–450)
RBC: 4.68 x10E6/uL (ref 3.77–5.28)
RDW: 11.4 % — ABNORMAL LOW (ref 11.7–15.4)
WBC: 2.2 10*3/uL — CL (ref 3.4–10.8)

## 2021-05-04 LAB — COMP. METABOLIC PANEL (12)
AST: 29 IU/L (ref 0–40)
Albumin/Globulin Ratio: 2 (ref 1.2–2.2)
Albumin: 4.5 g/dL (ref 3.9–5.0)
Alkaline Phosphatase: 60 IU/L (ref 44–121)
BUN/Creatinine Ratio: 10 (ref 9–23)
BUN: 11 mg/dL (ref 6–20)
Bilirubin Total: 0.4 mg/dL (ref 0.0–1.2)
Calcium: 9.6 mg/dL (ref 8.7–10.2)
Chloride: 99 mmol/L (ref 96–106)
Creatinine, Ser: 1.09 mg/dL — ABNORMAL HIGH (ref 0.57–1.00)
Globulin, Total: 2.2 g/dL (ref 1.5–4.5)
Glucose: 87 mg/dL (ref 70–99)
Potassium: 4.8 mmol/L (ref 3.5–5.2)
Sodium: 134 mmol/L (ref 134–144)
Total Protein: 6.7 g/dL (ref 6.0–8.5)
eGFR: 74 mL/min/{1.73_m2} (ref >59)

## 2021-05-09 ENCOUNTER — Other Ambulatory Visit: Payer: Self-pay

## 2021-05-09 ENCOUNTER — Other Ambulatory Visit: Payer: Medicaid Other

## 2021-05-09 ENCOUNTER — Ambulatory Visit (INDEPENDENT_AMBULATORY_CARE_PROVIDER_SITE_OTHER): Payer: Medicaid Other | Admitting: Nurse Practitioner

## 2021-05-09 DIAGNOSIS — R1030 Lower abdominal pain, unspecified: Secondary | ICD-10-CM | POA: Diagnosis not present

## 2021-05-09 DIAGNOSIS — Z3042 Encounter for surveillance of injectable contraceptive: Secondary | ICD-10-CM

## 2021-05-09 MED ORDER — MEDROXYPROGESTERONE ACETATE 150 MG/ML IM SUSP
150.0000 mg | Freq: Once | INTRAMUSCULAR | Status: AC
  Administered 2021-05-09: 14:00:00 150 mg via INTRAMUSCULAR

## 2021-05-09 NOTE — Progress Notes (Signed)
Patient in for depo injection.

## 2021-05-10 LAB — H. PYLORI BREATH TEST: H pylori Breath Test: NEGATIVE

## 2021-05-23 ENCOUNTER — Other Ambulatory Visit: Payer: Self-pay | Admitting: Nurse Practitioner

## 2021-05-23 DIAGNOSIS — H9190 Unspecified hearing loss, unspecified ear: Secondary | ICD-10-CM

## 2021-05-23 DIAGNOSIS — H903 Sensorineural hearing loss, bilateral: Secondary | ICD-10-CM

## 2021-05-23 NOTE — Progress Notes (Signed)
    [facility] [address] [place], [place]  [address] Phone:  [phone]   Fax:  [phone] 

## 2021-05-28 DIAGNOSIS — H903 Sensorineural hearing loss, bilateral: Secondary | ICD-10-CM | POA: Diagnosis not present

## 2021-05-31 ENCOUNTER — Ambulatory Visit: Payer: Self-pay | Admitting: Nurse Practitioner

## 2021-06-14 ENCOUNTER — Ambulatory Visit: Payer: Self-pay | Admitting: Nurse Practitioner

## 2021-06-19 ENCOUNTER — Ambulatory Visit: Payer: Medicaid Other | Admitting: Nurse Practitioner

## 2021-06-19 ENCOUNTER — Other Ambulatory Visit: Payer: Self-pay

## 2021-06-19 ENCOUNTER — Encounter: Payer: Self-pay | Admitting: Nurse Practitioner

## 2021-06-19 VITALS — BP 120/63 | HR 97 | Temp 98.4°F | Ht 67.0 in | Wt 138.2 lb

## 2021-06-19 DIAGNOSIS — H903 Sensorineural hearing loss, bilateral: Secondary | ICD-10-CM | POA: Diagnosis not present

## 2021-06-19 DIAGNOSIS — F32A Depression, unspecified: Secondary | ICD-10-CM | POA: Diagnosis not present
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
## 2021-06-19 NOTE — Patient Instructions (Signed)
Preventive Care 21-23 Years Old, Female °Preventive care refers to lifestyle choices and visits with your health care provider that can promote health and wellness. Preventive care visits are also called wellness exams. °What can I expect for my preventive care visit? °Counseling °During your preventive care visit, your health care provider may ask about your: °Medical history, including: °Past medical problems. °Family medical history. °Pregnancy history. °Current health, including: °Menstrual cycle. °Method of birth control. °Emotional well-being. °Home life and relationship well-being. °Sexual activity and sexual health. °Lifestyle, including: °Alcohol, nicotine or tobacco, and drug use. °Access to firearms. °Diet, exercise, and sleep habits. °Work and work environment. °Sunscreen use. °Safety issues such as seatbelt and bike helmet use. °Physical exam °Your health care provider may check your: °Height and weight. These may be used to calculate your BMI (body mass index). BMI is a measurement that tells if you are at a healthy weight. °Waist circumference. This measures the distance around your waistline. This measurement also tells if you are at a healthy weight and may help predict your risk of certain diseases, such as type 2 diabetes and high blood pressure. °Heart rate and blood pressure. °Body temperature. °Skin for abnormal spots. °What immunizations do I need? °Vaccines are usually given at various ages, according to a schedule. Your health care provider will recommend vaccines for you based on your age, medical history, and lifestyle or other factors, such as travel or where you work. °What tests do I need? °Screening °Your health care provider may recommend screening tests for certain conditions. This may include: °Pelvic exam and Pap test. °Lipid and cholesterol levels. °Diabetes screening. This is done by checking your blood sugar (glucose) after you have not eaten for a while (fasting). °Hepatitis B  test. °Hepatitis C test. °HIV (human immunodeficiency virus) test. °STI (sexually transmitted infection) testing, if you are at risk. °BRCA-related cancer screening. This may be done if you have a family history of breast, ovarian, tubal, or peritoneal cancers. °Talk with your health care provider about your test results, treatment options, and if necessary, the need for more tests. °Follow these instructions at home: °Eating and drinking ° °Eat a healthy diet that includes fresh fruits and vegetables, whole grains, lean protein, and low-fat dairy products. °Take vitamin and mineral supplements as recommended by your health care provider. °Do not drink alcohol if: °Your health care provider tells you not to drink. °You are pregnant, may be pregnant, or are planning to become pregnant. °If you drink alcohol: °Limit how much you have to 0-1 drink a day. °Know how much alcohol is in your drink. In the [place] one drink equals one 12 oz bottle of beer (355 mL), one 5 oz glass of wine (148 mL), or one 1½ oz glass of hard liquor (44 mL). °Lifestyle °Brush your teeth every morning and night with fluoride toothpaste. Floss one time each day. °Exercise for at least 30 minutes 5 or more days each week. °Do not use any products that contain nicotine or tobacco. These products include cigarettes, chewing tobacco, and vaping devices, such as e-cigarettes. If you need help quitting, ask your health care provider. °Do not use drugs. °If you are sexually active, practice safe sex. Use a condom or other form of protection to prevent STIs. °If you do not wish to become pregnant, use a form of birth control. If you plan to become pregnant, see your health care provider for a prepregnancy visit. °Find healthy ways to manage stress, such as: °Meditation, yoga,   or listening to music. °Journaling. °Talking to a trusted person. °Spending time with friends and family. °Minimize exposure to UV radiation to reduce your risk of skin  cancer. °Safety °Always wear your seat belt while driving or riding in a vehicle. °Do not drive: °If you have been drinking alcohol. Do not ride with someone who has been drinking. °If you have been using any mind-altering substances or drugs. °While texting. °When you are tired or distracted. °Wear a helmet and other protective equipment during sports activities. °If you have firearms in your house, make sure you follow all gun safety procedures. °Seek help if you have been physically or sexually abused. °What's next? °Go to your health care provider once a year for an annual wellness visit. °Ask your health care provider how often you should have your eyes and teeth checked. °Stay up to date on all vaccines. °This information is not intended to replace advice given to you by your health care provider. Make sure you discuss any questions you have with your health care provider. °Document Revised: 10/24/2020 Document Reviewed: 10/24/2020 °Elsevier Patient Education © 2022 [employer] Inc. ° °

## 2021-06-21 ENCOUNTER — Encounter: Payer: Self-pay | Admitting: Nurse Practitioner

## 2021-07-31 ENCOUNTER — Ambulatory Visit: Payer: Self-pay

## 2021-08-06 DIAGNOSIS — S86892A Other injury of other muscle(s) and tendon(s) at lower leg level, left leg, initial encounter: Secondary | ICD-10-CM | POA: Diagnosis not present

## 2021-08-06 DIAGNOSIS — S86891A Other injury of other muscle(s) and tendon(s) at lower leg level, right leg, initial encounter: Secondary | ICD-10-CM | POA: Diagnosis not present

## 2021-08-12 ENCOUNTER — Ambulatory Visit: Payer: No Typology Code available for payment source | Admitting: Neurology

## 2021-08-23 ENCOUNTER — Encounter: Payer: Self-pay | Admitting: Physical Therapy

## 2021-08-23 NOTE — Therapy (Signed)
[place] [facility] ?[address] ?[place], [place], [address] ?Phone: [phone]   Fax:  [phone] ? ?Patient Details  ?Name: Whitney Gomez ?MRN: [id number] ?Date of Birth: [birth year] ?Referring Provider:  No ref. provider found ? ?Encounter Date: 08/23/2021 ? ?PHYSICAL THERAPY DISCHARGE SUMMARY ? ?Visits from Start of Care: 5 ? ?Current functional level related to goals / functional outcomes: ?Did not return since last scheduled session  ?  ?Remaining deficits: ?Unable to assess  ?  ?Education / Equipment: ?N/a   ? ?Patient agrees to discharge. Patient goals were not met. Patient is being discharged due to not returning since the last visit. ? ? ?Rossana Molchan U PT, DPT, PN2  ? ?Supplemental Physical Therapist ?[facility]  ? ? ? ? ? ?[place] ?[place] [address] ?[place], [place], [address] ?Phone: [phone]   Fax:  [phone] ?

## 2021-08-29 ENCOUNTER — Encounter: Payer: Self-pay | Admitting: Neurology

## 2021-08-29 ENCOUNTER — Ambulatory Visit: Payer: Medicaid Other | Admitting: Neurology

## 2021-08-29 VITALS — BP 117/74 | HR 69 | Ht 66.0 in | Wt 147.5 lb

## 2021-08-29 DIAGNOSIS — R410 Disorientation, unspecified: Secondary | ICD-10-CM | POA: Insufficient documentation

## 2021-08-29 MED ORDER — LAMOTRIGINE 100 MG PO TABS: 100.0000 mg | ORAL_TABLET | Freq: Two times a day (BID) | ORAL | 11 refills | Status: DC

## 2021-08-29 MED ORDER — LAMOTRIGINE 25 MG PO TABS: ORAL_TABLET | ORAL | 0 refills | Status: DC
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
## 2021-09-02 ENCOUNTER — Other Ambulatory Visit: Payer: Medicaid Other | Admitting: *Deleted

## 2021-09-03 ENCOUNTER — Encounter: Payer: Self-pay | Admitting: Neurology

## 2021-09-04 ENCOUNTER — Telehealth: Payer: Self-pay | Admitting: Neurology

## 2021-09-04 ENCOUNTER — Other Ambulatory Visit: Payer: Medicaid Other | Admitting: *Deleted

## 2021-09-04 NOTE — Telephone Encounter (Signed)
MCD healthy blue Berkley Harvey: [id number] (exp. 09/04/21 to 11/02/21) order sent to GI. They will reach out to the patient to schedule.  ?

## 2021-09-10 ENCOUNTER — Ambulatory Visit: Payer: Medicaid Other | Admitting: Neurology

## 2021-09-10 ENCOUNTER — Ambulatory Visit: Payer: No Typology Code available for payment source | Admitting: Neurology

## 2021-09-10 DIAGNOSIS — R410 Disorientation, unspecified: Secondary | ICD-10-CM

## 2021-09-11 ENCOUNTER — Ambulatory Visit: Payer: Medicaid Other | Admitting: Adult Health

## 2021-09-16 ENCOUNTER — Ambulatory Visit: Payer: Medicaid Other | Admitting: Adult Health

## 2021-09-16 ENCOUNTER — Ambulatory Visit: Payer: Self-pay | Admitting: Nurse Practitioner

## 2021-09-23 ENCOUNTER — Telehealth: Payer: Self-pay | Admitting: Neurology

## 2021-09-23 LAB — THC,MS,WB/SP RFX
Cannabidiol: NEGATIVE ng/mL
Cannabinoid Confirmation: POSITIVE
Carboxy-THC: 86.7 ng/mL
Hydroxy-THC: 3.2 ng/mL
Tetrahydrocannabinol(THC): 9.1 ng/mL

## 2021-09-23 LAB — COMPREHENSIVE METABOLIC PANEL
ALT: 22 IU/L (ref 0–32)
AST: 32 IU/L (ref 0–40)
Albumin/Globulin Ratio: 2 (ref 1.2–2.2)
Albumin: 4.3 g/dL (ref 3.9–5.0)
Alkaline Phosphatase: 83 IU/L (ref 44–121)
BUN/Creatinine Ratio: 16 (ref 9–23)
BUN: 15 mg/dL (ref 6–20)
Bilirubin Total: 0.4 mg/dL (ref 0.0–1.2)
CO2: 20 mmol/L (ref 20–29)
Calcium: 9.6 mg/dL (ref 8.7–10.2)
Chloride: 107 mmol/L — ABNORMAL HIGH (ref 96–106)
Creatinine, Ser: 0.95 mg/dL (ref 0.57–1.00)
Globulin, Total: 2.2 g/dL (ref 1.5–4.5)
Glucose: 83 mg/dL (ref 70–99)
Potassium: 4.4 mmol/L (ref 3.5–5.2)
Sodium: 141 mmol/L (ref 134–144)
Total Protein: 6.5 g/dL (ref 6.0–8.5)
eGFR: 86 mL/min/{1.73_m2} (ref >59)

## 2021-09-23 LAB — DRUG SCREEN 10 W/CONF, SERUM
Amphetamines, IA: NEGATIVE ng/mL
Barbiturates, IA: NEGATIVE ug/mL
Benzodiazepines, IA: NEGATIVE ng/mL
Cocaine & Metabolite, IA: NEGATIVE ng/mL
Methadone, IA: NEGATIVE ng/mL
Opiates, IA: NEGATIVE ng/mL
Oxycodones, IA: NEGATIVE ng/mL
Phencyclidine, IA: NEGATIVE ng/mL
Propoxyphene, IA: NEGATIVE ng/mL
THC(Marijuana) Metabolite, IA: POSITIVE ng/mL — AB

## 2021-09-23 LAB — CBC WITH DIFFERENTIAL
Basophils Absolute: 0 10*3/uL (ref 0.0–0.2)
Basos: 1 %
EOS (ABSOLUTE): 0.1 10*3/uL (ref 0.0–0.4)
Eos: 4 %
Hematocrit: 41.1 % (ref 34.0–46.6)
Hemoglobin: 14 g/dL (ref 11.1–15.9)
Immature Grans (Abs): 0 10*3/uL (ref 0.0–0.1)
Immature Granulocytes: 0 %
Lymphocytes Absolute: 1.8 10*3/uL (ref 0.7–3.1)
Lymphs: 58 %
MCH: 30.1 pg (ref 26.6–33.0)
MCHC: 34.1 g/dL (ref 31.5–35.7)
MCV: 88 fL (ref 79–97)
Monocytes Absolute: 0.3 10*3/uL (ref 0.1–0.9)
Monocytes: 8 %
Neutrophils Absolute: 0.9 10*3/uL — ABNORMAL LOW (ref 1.4–7.0)
Neutrophils: 29 %
RBC: 4.65 x10E6/uL (ref 3.77–5.28)
RDW: 12.4 % (ref 11.7–15.4)
WBC: 3.1 10*3/uL — ABNORMAL LOW (ref 3.4–10.8)

## 2021-09-23 LAB — VITAMIN B12: Vitamin B-12: 547 pg/mL (ref 232–1245)

## 2021-09-23 LAB — ANA W/REFLEX IF POSITIVE: Anti Nuclear Antibody (ANA): NEGATIVE

## 2021-09-23 LAB — RPR: RPR Ser Ql: NONREACTIVE

## 2021-09-23 LAB — TSH: TSH: 1.53 u[IU]/mL (ref 0.450–4.500)

## 2021-09-23 LAB — HIV ANTIBODY (ROUTINE TESTING W REFLEX): HIV Screen 4th Generation wRfx: NONREACTIVE

## 2021-09-23 NOTE — Telephone Encounter (Signed)
Please call patient and her mother, laboratory evaluation showed ? ?--- Positive drug screen, positive for marijuana- ? ?--- Decreased WBC 3.1, decreased absolute neutrophil 0.9, slight improvement compared to 4 months ago, ? ?--- Rest of the laboratory evaluation showed no significant abnormalities ? ?--- I have forwarded lab results to her primary care Barbette Merino, NP, she may contact her for repeat CBC later ? ? ? ?

## 2021-09-23 NOTE — Telephone Encounter (Signed)
Pt verified by name and DOB,  normal results given per provider, voiced understanding all question answered.  

## 2021-09-24 ENCOUNTER — Ambulatory Visit: Payer: No Typology Code available for payment source | Admitting: Neurology
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
## 2021-10-01 NOTE — Telephone Encounter (Signed)
Pt said, after discussing results, want to know if need to stay on the medication for seizure and do I have seizures. Would like a call from the nurse.

## 2021-10-01 NOTE — Telephone Encounter (Signed)
I spoke to the patient. She has not completed her work-up yet. MRI brain pending scheduling. She will call to make an appt (provided the number). She also verbalized understanding to continue the prescribed medication and make her follow up to be re-checked in July. She was not able to commit to a time today. She is waiting for her work schedule to be released for that month.

## 2021-10-24 ENCOUNTER — Ambulatory Visit
Admission: RE | Admit: 2021-10-24 | Discharge: 2021-10-24 | Disposition: A | Discharge status: Home / Self Care | Payer: Medicaid Other | Source: Ambulatory Visit | Attending: Neurology | Admitting: Neurology

## 2021-10-24 DIAGNOSIS — R413 Other amnesia: Secondary | ICD-10-CM | POA: Diagnosis not present

## 2021-10-24 DIAGNOSIS — R410 Disorientation, unspecified: Secondary | ICD-10-CM

## 2021-10-24 MED ORDER — GADOBENATE DIMEGLUMINE 529 MG/ML IV SOLN
15.0000 mL | Freq: Once | INTRAVENOUS | Status: AC | PRN
  Administered 2021-10-24: 15 mL via INTRAVENOUS

## 2021-10-28 ENCOUNTER — Telehealth: Payer: Self-pay | Admitting: Neurology

## 2021-10-28 NOTE — Telephone Encounter (Signed)
Pt would like a call from the nurse with a question about MRI results.  Have reviewed MRI results on MyChart.

## 2021-10-28 NOTE — Telephone Encounter (Signed)
Pt want to know if she should pick up lamoTRIgine (LAMICTAL) 100 MG tablet, or lamoTRIgine (LAMICTAL) 25 MG tablet, please advise

## 2021-10-29 NOTE — Telephone Encounter (Signed)
I called patient to discuss.  No answer, voicemail was full.  Unable to leave a message.  I will attempt to send patient a MyChart message.

## 2021-10-29 NOTE — Telephone Encounter (Signed)
FINDINGS: Brain: No restricted diffusion to suggest acute or subacute infarct. No acute hemorrhage, mass, mass effect, or midline shift. No hydrocephalus or extra-axial collection. No abnormal parenchymal or meningeal enhancement. No evidence of cerebral atrophy. No abnormal T2 hyperintense signal in the periventricular or juxtacortical Denson matter.   Vascular: Normal flow voids.  Normal vascular enhancement.   Skull and upper cervical spine: T1 and T2 hyperintense focus in the right parietal calvarium, consistent with a benign hemangioma. Otherwise normal marrow signal.   Sinuses/Orbits: No acute finding.   Other: The mastoids are well aerated.   IMPRESSION: Normal brain MRI. No etiology is seen for the patient's reported memory loss. ___________________________________  Reviewed the above MRI findings. She would also like her EEG results.

## 2021-10-30 NOTE — Telephone Encounter (Signed)
Please call patient EEG showed no significant abnormality.  MRI of the brain with without contrast on October 25 2021 was normal

## 2021-10-31 NOTE — Telephone Encounter (Signed)
I spoke to the patient and provided her with the results. She picked up her lamotrigine and will start the titiration with the 25mg  tablets. She is aware to contact to report any adverse side effects, especially the presence of a rash.

## 2021-12-05 ENCOUNTER — Ambulatory Visit: Payer: Medicaid Other | Admitting: Adult Health

## 2021-12-18 ENCOUNTER — Other Ambulatory Visit: Payer: Self-pay | Admitting: Nurse Practitioner

## 2021-12-18 ENCOUNTER — Ambulatory Visit
Admission: RE | Admit: 2021-12-18 | Discharge: 2021-12-18 | Disposition: A | Discharge status: Home / Self Care | Payer: Medicaid Other | Source: Ambulatory Visit | Attending: Nurse Practitioner | Admitting: Nurse Practitioner

## 2021-12-18 DIAGNOSIS — R52 Pain, unspecified: Secondary | ICD-10-CM | POA: Insufficient documentation

## 2021-12-18 DIAGNOSIS — M25561 Pain in right knee: Secondary | ICD-10-CM | POA: Diagnosis not present

## 2021-12-30 ENCOUNTER — Telehealth: Payer: Self-pay | Admitting: Neurology

## 2021-12-30 ENCOUNTER — Ambulatory Visit: Payer: Medicaid Other | Admitting: Neurology

## 2021-12-30 NOTE — Telephone Encounter (Signed)
Pt cancelled appt due to not feeling well 

## 2022-09-17 IMAGING — MR MR LUMBAR SPINE WO/W CM
4 of 7 series · 29 of 48 positions shown · IV contrast (5.5 ml gadavist)
Comparison: Radiograph from 05/21/2020.

CLINICAL DATA: Initial evaluation for chronic bilateral lower back
pain with left-sided sciatica.

EXAM:
MRI LUMBAR SPINE WITHOUT AND WITH CONTRAST
TECHNIQUE: Multiplanar and multiecho pulse sequences of the lumbar spine were
obtained without and with intravenous contrast.
CONTRAST:  5.5mL GADAVIST GADOBUTROL 1 MMOL/ML IV SOLN

[Series 5: T1 · sagittal · 4.0mm · 0.81mm/px · 3 of 15 slices shown (1 of 2)]
[im 1/15]
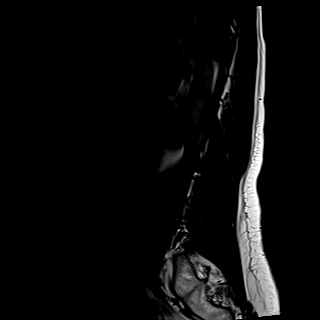
[im 8/15]
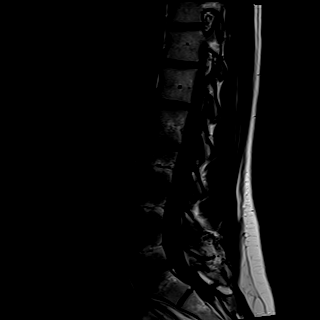
[im 15/15]
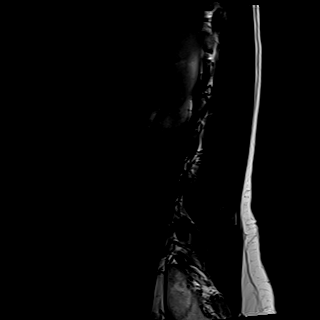

[Series 7: T2 · axial · 4.0mm · 0.62mm/px · z∈[+33,+253]mm · 11 of 43 slices shown]
[im 1/43]
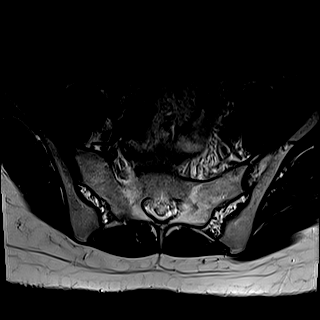
[im 5/43]
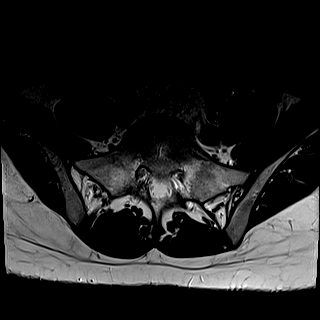
[im 9/43]
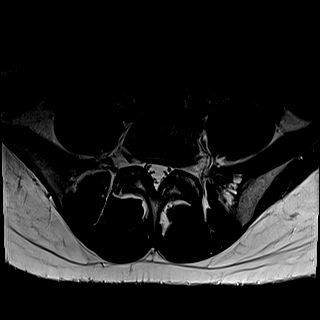
[im 13/43]
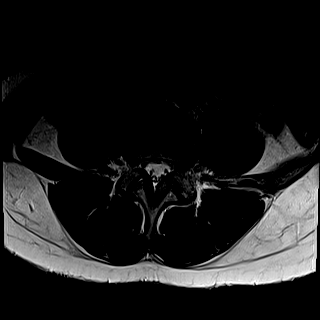
[im 17/43]
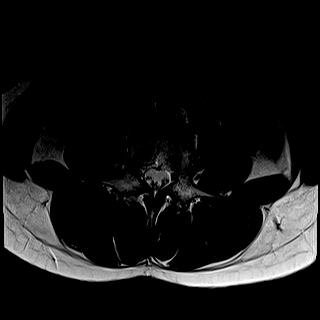
[im 22/43]
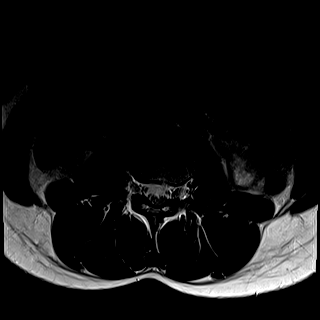
[im 26/43]
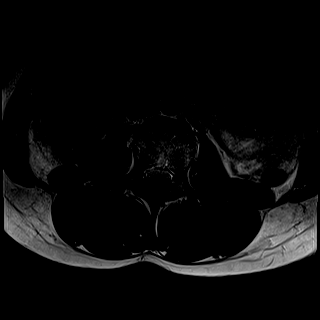
[im 30/43]
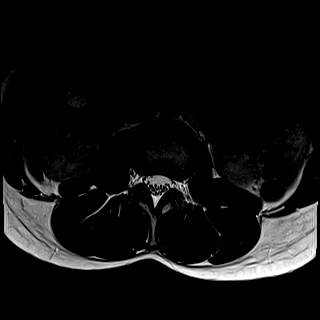
[im 34/43]
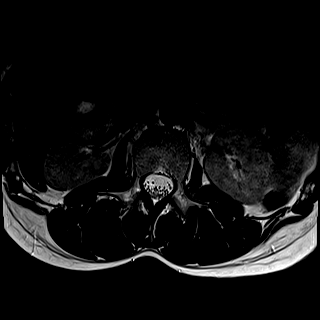
[im 38/43]
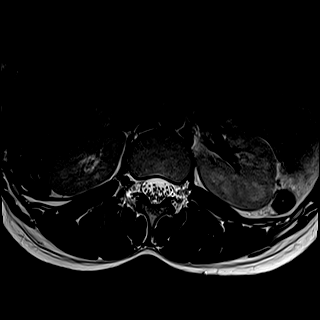
[im 43/43]
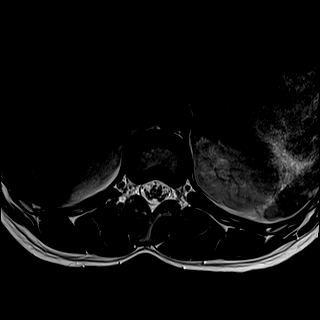

[Series 8: T1 · axial · 4.0mm · 0.39mm/px · z∈[+33,+253]mm · 11 of 43 slices shown (2 of 2)]
[im 1/43]
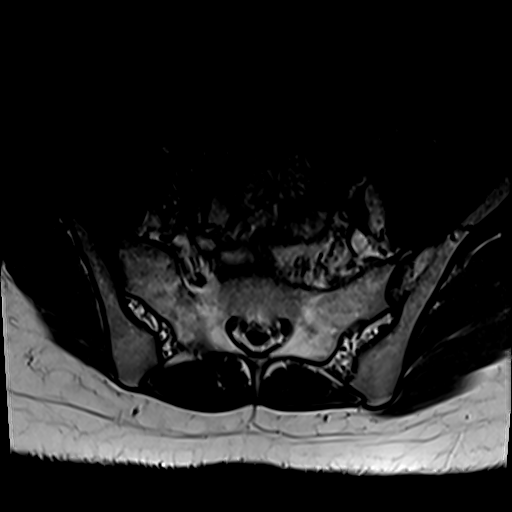
[im 5/43]
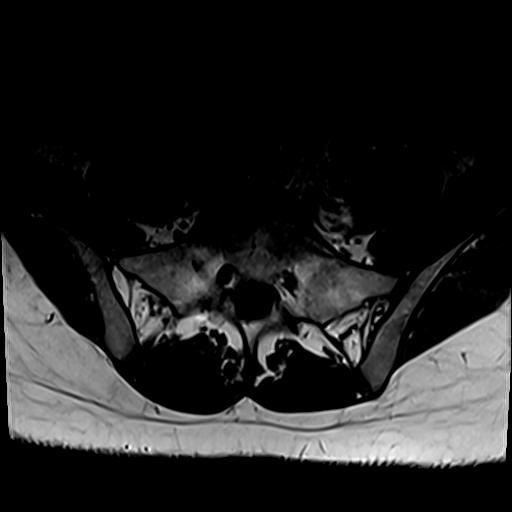
[im 9/43]
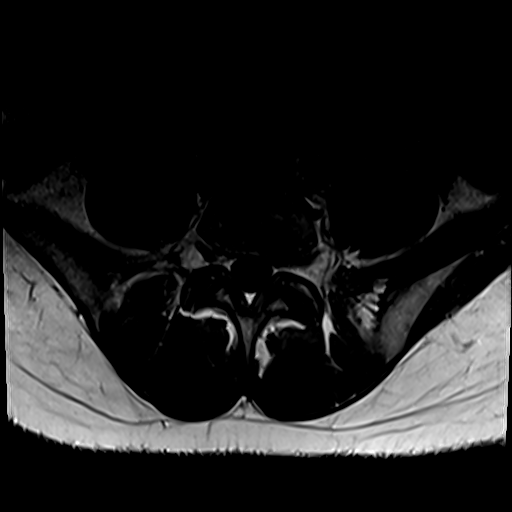
[im 13/43]
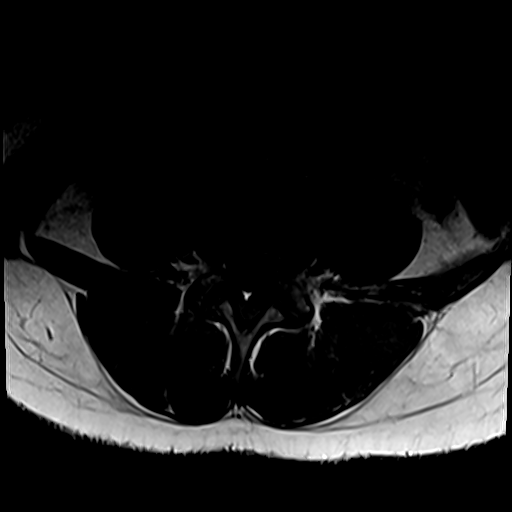
[im 17/43]
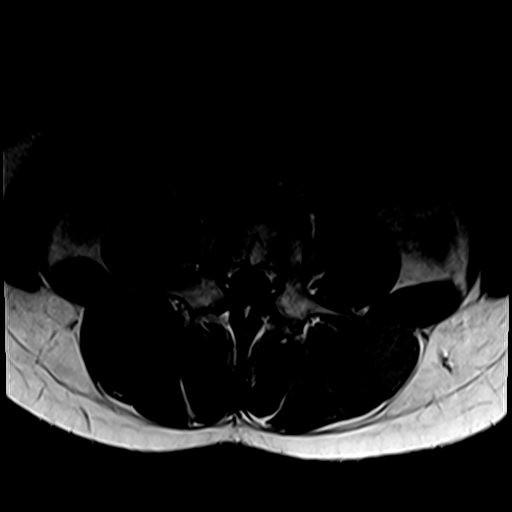
[im 22/43]
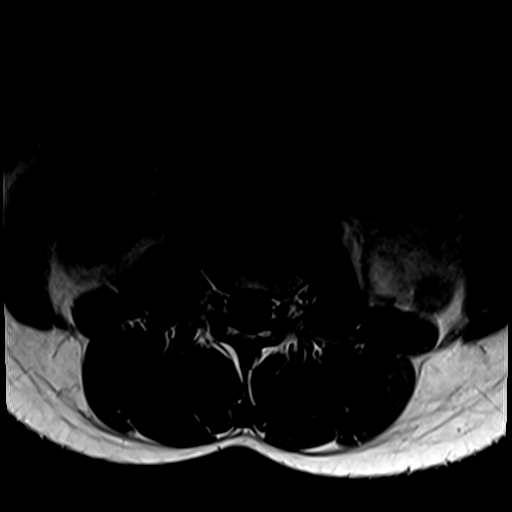
[im 26/43]
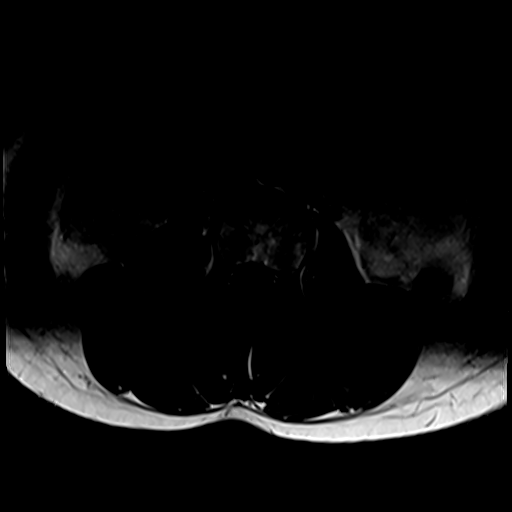
[im 30/43]
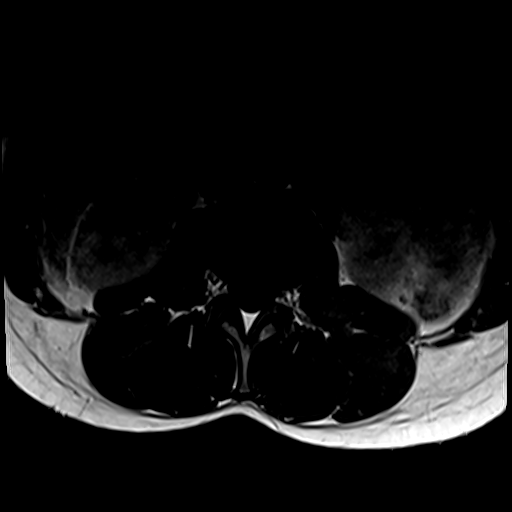
[im 34/43]
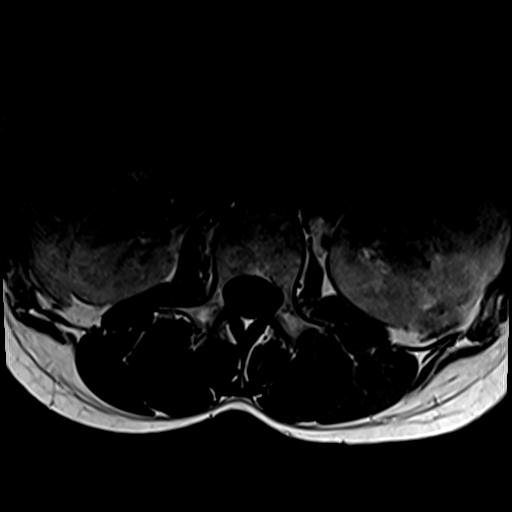
[im 38/43]
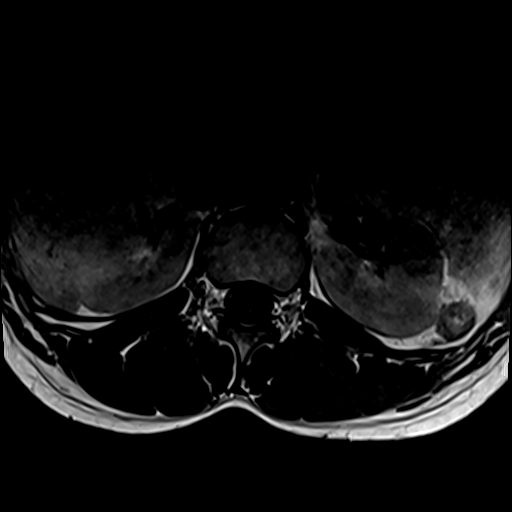
[im 43/43]
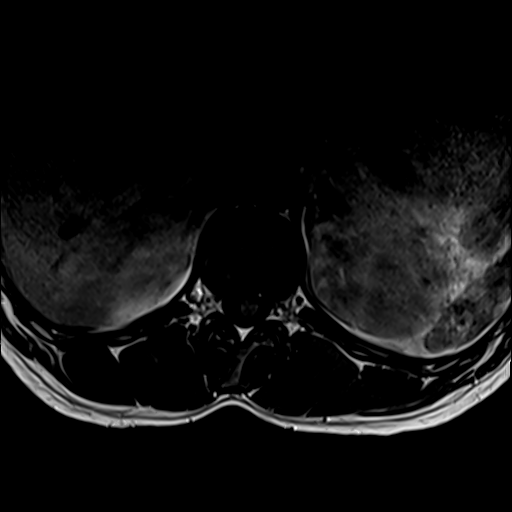

[Series 9: T2 post-contrast · sagittal · 4.0mm · 0.81mm/px · 4 of 15 slices shown]
[im 1/15]
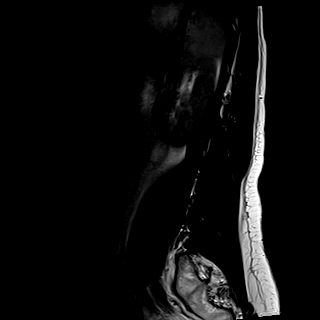
[im 5/15]
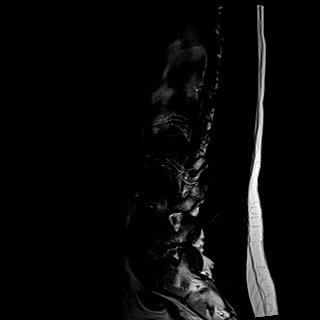
[im 10/15]
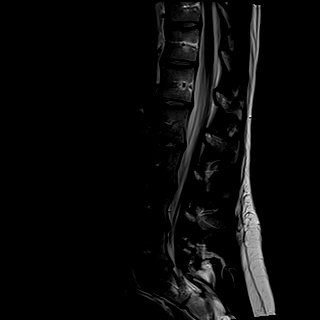
[im 15/15]
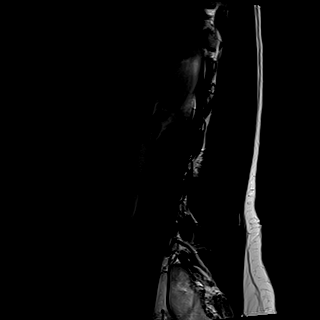

[29 of 48 positions shown; findings below may reference images not displayed]

FINDINGS: Segmentation: Standard. Lowest well-formed disc space labeled the
L5-S1 level.

Alignment: Physiologic with preservation of the normal lumbar
lordosis. No listhesis.

Vertebrae: Vertebral body height maintained without acute or chronic
fracture. Bone marrow signal intensity within normal limits. No
discrete or worrisome osseous lesions. No abnormal marrow edema or
enhancement.

Conus medullaris and cauda equina: Conus extends to the L1 level.
Conus and cauda equina appear normal.

Paraspinal and other soft tissues: Paraspinous soft tissues within
normal limits. Visualized visceral structures are unremarkable.

Disc levels:

Lumbar spine is image from the T11-12 level inferiorly. No
significant findings are seen through the L3-4 level.

L4-5: Normal interspace. Mild facet and ligament flavum hypertrophy.
No significant spinal stenosis. Foramina remain patent. No
impingement.

L5-S1: Subtle left foraminal disc protrusion (series 8, image 35).
Protruding disc closely approximates the exiting left L5 nerve root
without frank impingement. Mild bilateral facet hypertrophy. No
significant canal or lateral recess stenosis. Mild left L5 foraminal
narrowing. Right neural foramina remains patent.
IMPRESSION: 1. Subtle small left foraminal disc protrusion at L5-S1, closely
approximating and potentially irritating the exiting left L5 nerve
root. Finding could contribute to left-sided radicular symptoms.
2. Mild bilateral facet hypertrophy at L4-5 and L5-S1.
3. Otherwise unremarkable and normal MRI of the lumbar spine.

## 2022-09-22 ENCOUNTER — Other Ambulatory Visit: Payer: Self-pay | Admitting: Neurology

## 2022-10-21 ENCOUNTER — Other Ambulatory Visit: Payer: Self-pay | Admitting: Neurology

## 2022-10-21 NOTE — Telephone Encounter (Signed)
After checking pt's DPR a vm was left asking pt to call concerning an appointment needing to be scheduled.  Billing did confirm that pt has not addressed her no show fee from her last no show appointment.  Before an appointment can be scheduled she will need to speak with billing.

## 2023-06-02 DIAGNOSIS — J069 Acute upper respiratory infection, unspecified: Secondary | ICD-10-CM | POA: Diagnosis not present

## 2023-06-02 DIAGNOSIS — R509 Fever, unspecified: Secondary | ICD-10-CM | POA: Diagnosis not present

## 2023-08-07 ENCOUNTER — Other Ambulatory Visit: Payer: Self-pay | Admitting: Nurse Practitioner

## 2023-11-09 DIAGNOSIS — M542 Cervicalgia: Secondary | ICD-10-CM | POA: Diagnosis not present

## 2023-11-09 DIAGNOSIS — R519 Headache, unspecified: Secondary | ICD-10-CM | POA: Diagnosis not present

## 2023-11-10 ENCOUNTER — Telehealth: Payer: Self-pay

## 2023-11-10 NOTE — Telephone Encounter (Signed)
Forwarded to patient's provider

## 2023-12-02 DIAGNOSIS — M5459 Other low back pain: Secondary | ICD-10-CM | POA: Diagnosis not present

## 2024-01-20 DIAGNOSIS — F32A Depression, unspecified: Secondary | ICD-10-CM | POA: Diagnosis not present

## 2024-01-20 DIAGNOSIS — L2084 Intrinsic (allergic) eczema: Secondary | ICD-10-CM | POA: Diagnosis not present

## 2024-01-20 DIAGNOSIS — J309 Allergic rhinitis, unspecified: Secondary | ICD-10-CM | POA: Diagnosis not present

## 2024-01-20 DIAGNOSIS — R569 Unspecified convulsions: Secondary | ICD-10-CM | POA: Diagnosis not present

## 2024-01-20 DIAGNOSIS — H903 Sensorineural hearing loss, bilateral: Secondary | ICD-10-CM | POA: Diagnosis not present

## 2024-02-04 DIAGNOSIS — H903 Sensorineural hearing loss, bilateral: Secondary | ICD-10-CM | POA: Diagnosis not present

## 2024-02-04 DIAGNOSIS — R94120 Abnormal auditory function study: Secondary | ICD-10-CM | POA: Diagnosis not present

## 2024-02-22 DIAGNOSIS — S60222A Contusion of left hand, initial encounter: Secondary | ICD-10-CM | POA: Diagnosis not present

## 2024-02-22 DIAGNOSIS — S52122A Displaced fracture of head of left radius, initial encounter for closed fracture: Secondary | ICD-10-CM | POA: Diagnosis not present

## 2024-02-22 DIAGNOSIS — S52132A Displaced fracture of neck of left radius, initial encounter for closed fracture: Secondary | ICD-10-CM | POA: Diagnosis not present

## 2024-02-22 DIAGNOSIS — S52301A Unspecified fracture of shaft of right radius, initial encounter for closed fracture: Secondary | ICD-10-CM | POA: Diagnosis not present

## 2024-02-22 DIAGNOSIS — S6992XA Unspecified injury of left wrist, hand and finger(s), initial encounter: Secondary | ICD-10-CM | POA: Diagnosis not present

## 2024-03-15 DIAGNOSIS — M545 Low back pain, unspecified: Secondary | ICD-10-CM | POA: Diagnosis not present

## 2024-03-19 DIAGNOSIS — S52502A Unspecified fracture of the lower end of left radius, initial encounter for closed fracture: Secondary | ICD-10-CM | POA: Diagnosis not present

## 2024-04-01 DIAGNOSIS — S52552D Other extraarticular fracture of lower end of left radius, subsequent encounter for closed fracture with routine healing: Secondary | ICD-10-CM | POA: Diagnosis not present

## 2024-04-01 DIAGNOSIS — M25532 Pain in left wrist: Secondary | ICD-10-CM | POA: Diagnosis not present

## 2024-05-31 ENCOUNTER — Telehealth: Payer: Self-pay

## 2024-05-31 NOTE — Transitions of Care (Post Inpatient/ED Visit) (Signed)
" ° °  05/31/2024  Name: Whitney Gomez MRN: [id number] DOB: February 22, 2001  Today's TOC FU Call Status: Today's TOC FU Call Status:: Unsuccessful Call (1st Attempt) Unsuccessful Call (1st Attempt) Date: 05/31/24  Attempted to reach the patient regarding the most recent Inpatient/ED visit.  Follow Up Plan: Additional outreach attempts will be made to reach the patient to complete the Transitions of Care (Post Inpatient/ED visit) call.   Signature Geroge Hahn, CMA   "

## 2024-06-01 ENCOUNTER — Telehealth: Payer: Self-pay

## 2024-06-01 NOTE — Transitions of Care (Post Inpatient/ED Visit) (Signed)
" ° °  06/01/2024  Name: Whitney Gomez MRN: [id number] DOB: 2000/07/12  Today's TOC FU Call Status: Today's TOC FU Call Status:: Successful TOC FU Call Completed TOC FU Call Complete Date: 06/01/24  Patient's Name and Date of Birth confirmed. DOB, Name  Transition Care Management Follow-up Telephone Call Date of Discharge: 05/30/24  Items Reviewed:    Medications Reviewed Today: Medications Reviewed Today   Medications were not reviewed in this encounter     Home Care and Equipment/Supplies:    Functional Questionnaire:    Follow up appointments reviewed:   [facility] not [facility] a [facility] of [facility].    SIGNATURE Geroge Hahn, CMA   "
